# Patient Record
Sex: Female | Born: 1940
Health system: Southern US, Community
[De-identification: ages and names within clinical notes are randomized; demographics above are authoritative.]

## PROBLEM LIST (undated history)

## (undated) DIAGNOSIS — K279 Peptic ulcer, site unspecified, unspecified as acute or chronic, without hemorrhage or perforation: Secondary | ICD-10-CM

## (undated) DIAGNOSIS — I10 Essential (primary) hypertension: Secondary | ICD-10-CM

## (undated) DIAGNOSIS — K219 Gastro-esophageal reflux disease without esophagitis: Secondary | ICD-10-CM

## (undated) HISTORY — PX: CHOLECYSTECTOMY: SHX55

## (undated) HISTORY — PX: ABDOMINAL HYSTERECTOMY: SHX81

## (undated) HISTORY — PX: COLONOSCOPY: SHX174

## (undated) HISTORY — PX: APPENDECTOMY: SHX54

---

## 1999-12-11 ENCOUNTER — Other Ambulatory Visit: Admission: RE | Admit: 1999-12-11 | Discharge: 1999-12-11 | Payer: Self-pay | Admitting: Family Medicine

## 1999-12-15 ENCOUNTER — Encounter: Payer: Self-pay | Admitting: Family Medicine

## 1999-12-15 ENCOUNTER — Encounter: Admission: RE | Admit: 1999-12-15 | Discharge: 1999-12-15 | Payer: Self-pay | Admitting: Family Medicine

## 2000-07-19 ENCOUNTER — Encounter: Payer: Self-pay | Admitting: Family Medicine

## 2000-07-19 ENCOUNTER — Encounter: Admission: RE | Admit: 2000-07-19 | Discharge: 2000-07-19 | Payer: Self-pay | Admitting: Family Medicine

## 2001-02-08 ENCOUNTER — Encounter: Payer: Self-pay | Admitting: Family Medicine

## 2001-02-08 ENCOUNTER — Encounter: Admission: RE | Admit: 2001-02-08 | Discharge: 2001-02-08 | Payer: Self-pay | Admitting: Family Medicine

## 2003-04-11 ENCOUNTER — Other Ambulatory Visit: Admission: RE | Admit: 2003-04-11 | Discharge: 2003-04-11 | Payer: Self-pay | Admitting: Family Medicine

## 2003-05-03 ENCOUNTER — Encounter: Admission: RE | Admit: 2003-05-03 | Discharge: 2003-05-03 | Payer: Self-pay | Admitting: Family Medicine

## 2004-04-10 ENCOUNTER — Other Ambulatory Visit: Admission: RE | Admit: 2004-04-10 | Discharge: 2004-04-10 | Payer: Self-pay | Admitting: Family Medicine

## 2004-05-05 ENCOUNTER — Encounter: Admission: RE | Admit: 2004-05-05 | Discharge: 2004-05-05 | Payer: Self-pay | Admitting: Family Medicine

## 2005-04-16 ENCOUNTER — Other Ambulatory Visit: Admission: RE | Admit: 2005-04-16 | Discharge: 2005-04-16 | Payer: Self-pay | Admitting: Family Medicine

## 2005-05-15 ENCOUNTER — Encounter: Admission: RE | Admit: 2005-05-15 | Discharge: 2005-05-15 | Payer: Self-pay | Admitting: Family Medicine

## 2006-05-17 ENCOUNTER — Encounter: Admission: RE | Admit: 2006-05-17 | Discharge: 2006-05-17 | Payer: Self-pay | Admitting: Family Medicine

## 2006-06-10 ENCOUNTER — Other Ambulatory Visit: Admission: RE | Admit: 2006-06-10 | Discharge: 2006-06-10 | Payer: Self-pay | Admitting: Family Medicine

## 2007-05-18 ENCOUNTER — Encounter: Admission: RE | Admit: 2007-05-18 | Discharge: 2007-05-18 | Payer: Self-pay | Admitting: Family Medicine

## 2007-06-14 ENCOUNTER — Other Ambulatory Visit: Admission: RE | Admit: 2007-06-14 | Discharge: 2007-06-14 | Payer: Self-pay | Admitting: Family Medicine

## 2008-06-05 ENCOUNTER — Encounter: Admission: RE | Admit: 2008-06-05 | Discharge: 2008-06-05 | Payer: Self-pay | Admitting: Family Medicine

## 2008-06-13 ENCOUNTER — Other Ambulatory Visit: Admission: RE | Admit: 2008-06-13 | Discharge: 2008-06-13 | Payer: Self-pay | Admitting: Family Medicine

## 2009-06-26 ENCOUNTER — Encounter: Admission: RE | Admit: 2009-06-26 | Discharge: 2009-06-26 | Payer: Self-pay | Admitting: Family Medicine

## 2010-02-02 ENCOUNTER — Encounter: Payer: Self-pay | Admitting: Family Medicine

## 2010-06-11 ENCOUNTER — Other Ambulatory Visit: Payer: Self-pay | Admitting: Family Medicine

## 2010-06-11 DIAGNOSIS — Z1231 Encounter for screening mammogram for malignant neoplasm of breast: Secondary | ICD-10-CM

## 2010-07-01 ENCOUNTER — Ambulatory Visit
Admission: RE | Admit: 2010-07-01 | Discharge: 2010-07-01 | Disposition: A | Discharge status: Home / Self Care | Payer: Medicare Other | Source: Ambulatory Visit | Attending: Family Medicine | Admitting: Family Medicine

## 2010-07-01 DIAGNOSIS — Z1231 Encounter for screening mammogram for malignant neoplasm of breast: Secondary | ICD-10-CM

## 2010-07-21 ENCOUNTER — Other Ambulatory Visit: Payer: Self-pay | Admitting: Family Medicine

## 2010-07-21 ENCOUNTER — Other Ambulatory Visit (HOSPITAL_COMMUNITY)
Admission: RE | Admit: 2010-07-21 | Discharge: 2010-07-21 | Disposition: A | Discharge status: Home / Self Care | Payer: Medicare Other | Source: Ambulatory Visit | Attending: Family Medicine | Admitting: Family Medicine

## 2010-07-21 DIAGNOSIS — D069 Carcinoma in situ of cervix, unspecified: Secondary | ICD-10-CM | POA: Insufficient documentation

## 2011-01-31 DIAGNOSIS — J209 Acute bronchitis, unspecified: Secondary | ICD-10-CM | POA: Diagnosis not present

## 2011-01-31 DIAGNOSIS — R509 Fever, unspecified: Secondary | ICD-10-CM | POA: Diagnosis not present

## 2011-02-05 DIAGNOSIS — J9801 Acute bronchospasm: Secondary | ICD-10-CM | POA: Diagnosis not present

## 2011-02-05 DIAGNOSIS — J019 Acute sinusitis, unspecified: Secondary | ICD-10-CM | POA: Diagnosis not present

## 2011-06-23 DIAGNOSIS — R609 Edema, unspecified: Secondary | ICD-10-CM | POA: Diagnosis not present

## 2011-06-23 DIAGNOSIS — N3000 Acute cystitis without hematuria: Secondary | ICD-10-CM | POA: Diagnosis not present

## 2011-07-17 ENCOUNTER — Other Ambulatory Visit (HOSPITAL_COMMUNITY): Payer: Self-pay | Admitting: Family Medicine

## 2011-07-17 DIAGNOSIS — Z139 Encounter for screening, unspecified: Secondary | ICD-10-CM

## 2011-07-20 ENCOUNTER — Ambulatory Visit (HOSPITAL_COMMUNITY)
Admission: RE | Admit: 2011-07-20 | Discharge: 2011-07-20 | Disposition: A | Discharge status: Home / Self Care | Payer: Medicare Other | Source: Ambulatory Visit | Attending: Family Medicine | Admitting: Family Medicine

## 2011-07-20 DIAGNOSIS — Z1231 Encounter for screening mammogram for malignant neoplasm of breast: Secondary | ICD-10-CM | POA: Diagnosis not present

## 2011-07-20 DIAGNOSIS — Z139 Encounter for screening, unspecified: Secondary | ICD-10-CM

## 2011-08-19 ENCOUNTER — Encounter (INDEPENDENT_AMBULATORY_CARE_PROVIDER_SITE_OTHER): Payer: Self-pay | Admitting: *Deleted

## 2011-08-25 DIAGNOSIS — E7800 Pure hypercholesterolemia, unspecified: Secondary | ICD-10-CM | POA: Diagnosis not present

## 2011-08-25 DIAGNOSIS — M949 Disorder of cartilage, unspecified: Secondary | ICD-10-CM | POA: Diagnosis not present

## 2011-08-25 DIAGNOSIS — M899 Disorder of bone, unspecified: Secondary | ICD-10-CM | POA: Diagnosis not present

## 2011-08-25 DIAGNOSIS — Z1331 Encounter for screening for depression: Secondary | ICD-10-CM | POA: Diagnosis not present

## 2011-08-25 DIAGNOSIS — Z1211 Encounter for screening for malignant neoplasm of colon: Secondary | ICD-10-CM | POA: Diagnosis not present

## 2011-08-25 DIAGNOSIS — R7301 Impaired fasting glucose: Secondary | ICD-10-CM | POA: Diagnosis not present

## 2011-08-25 DIAGNOSIS — Z0000 Encounter for general adult medical examination without abnormal findings: Secondary | ICD-10-CM | POA: Diagnosis not present

## 2011-09-15 DIAGNOSIS — Z1211 Encounter for screening for malignant neoplasm of colon: Secondary | ICD-10-CM | POA: Diagnosis not present

## 2011-09-29 DIAGNOSIS — M899 Disorder of bone, unspecified: Secondary | ICD-10-CM | POA: Diagnosis not present

## 2011-10-07 DIAGNOSIS — Z23 Encounter for immunization: Secondary | ICD-10-CM | POA: Diagnosis not present

## 2011-10-27 DIAGNOSIS — H2510 Age-related nuclear cataract, unspecified eye: Secondary | ICD-10-CM | POA: Diagnosis not present

## 2011-10-27 DIAGNOSIS — H26019 Infantile and juvenile cortical, lamellar, or zonular cataract, unspecified eye: Secondary | ICD-10-CM | POA: Diagnosis not present

## 2011-10-27 DIAGNOSIS — H348392 Tributary (branch) retinal vein occlusion, unspecified eye, stable: Secondary | ICD-10-CM | POA: Diagnosis not present

## 2011-12-16 DIAGNOSIS — R05 Cough: Secondary | ICD-10-CM | POA: Diagnosis not present

## 2011-12-16 DIAGNOSIS — J069 Acute upper respiratory infection, unspecified: Secondary | ICD-10-CM | POA: Diagnosis not present

## 2012-01-12 DIAGNOSIS — N949 Unspecified condition associated with female genital organs and menstrual cycle: Secondary | ICD-10-CM | POA: Diagnosis not present

## 2012-01-29 DIAGNOSIS — J392 Other diseases of pharynx: Secondary | ICD-10-CM | POA: Diagnosis not present

## 2012-01-29 DIAGNOSIS — J210 Acute bronchiolitis due to respiratory syncytial virus: Secondary | ICD-10-CM | POA: Diagnosis not present

## 2012-04-11 ENCOUNTER — Other Ambulatory Visit: Payer: Self-pay | Admitting: Family Medicine

## 2012-04-11 DIAGNOSIS — R52 Pain, unspecified: Secondary | ICD-10-CM

## 2012-04-11 DIAGNOSIS — R609 Edema, unspecified: Secondary | ICD-10-CM

## 2012-04-11 DIAGNOSIS — E559 Vitamin D deficiency, unspecified: Secondary | ICD-10-CM | POA: Diagnosis not present

## 2012-04-12 ENCOUNTER — Ambulatory Visit
Admission: RE | Admit: 2012-04-12 | Discharge: 2012-04-12 | Disposition: A | Discharge status: Home / Self Care | Payer: Medicare Other | Source: Ambulatory Visit | Attending: Family Medicine | Admitting: Family Medicine

## 2012-04-12 DIAGNOSIS — M7989 Other specified soft tissue disorders: Secondary | ICD-10-CM | POA: Diagnosis not present

## 2012-04-12 DIAGNOSIS — M79609 Pain in unspecified limb: Secondary | ICD-10-CM | POA: Diagnosis not present

## 2012-04-12 DIAGNOSIS — R52 Pain, unspecified: Secondary | ICD-10-CM

## 2012-04-12 DIAGNOSIS — R609 Edema, unspecified: Secondary | ICD-10-CM

## 2012-07-17 DIAGNOSIS — R05 Cough: Secondary | ICD-10-CM | POA: Diagnosis not present

## 2012-07-21 ENCOUNTER — Other Ambulatory Visit: Payer: Self-pay | Admitting: Family Medicine

## 2012-07-21 ENCOUNTER — Ambulatory Visit
Admission: RE | Admit: 2012-07-21 | Discharge: 2012-07-21 | Disposition: A | Discharge status: Home / Self Care | Payer: Medicare Other | Source: Ambulatory Visit | Attending: Family Medicine | Admitting: Family Medicine

## 2012-07-21 DIAGNOSIS — R079 Chest pain, unspecified: Secondary | ICD-10-CM | POA: Diagnosis not present

## 2012-07-21 DIAGNOSIS — R05 Cough: Secondary | ICD-10-CM

## 2012-08-30 ENCOUNTER — Other Ambulatory Visit (HOSPITAL_COMMUNITY): Payer: Self-pay | Admitting: Family Medicine

## 2012-08-30 DIAGNOSIS — Z139 Encounter for screening, unspecified: Secondary | ICD-10-CM

## 2012-09-01 ENCOUNTER — Ambulatory Visit (HOSPITAL_COMMUNITY)
Admission: RE | Admit: 2012-09-01 | Discharge: 2012-09-01 | Disposition: A | Discharge status: Home / Self Care | Payer: Medicare Other | Source: Ambulatory Visit | Attending: Family Medicine | Admitting: Family Medicine

## 2012-09-01 DIAGNOSIS — Z1231 Encounter for screening mammogram for malignant neoplasm of breast: Secondary | ICD-10-CM | POA: Insufficient documentation

## 2012-09-01 DIAGNOSIS — Z139 Encounter for screening, unspecified: Secondary | ICD-10-CM

## 2012-09-05 DIAGNOSIS — R05 Cough: Secondary | ICD-10-CM | POA: Diagnosis not present

## 2012-09-05 DIAGNOSIS — J9801 Acute bronchospasm: Secondary | ICD-10-CM | POA: Diagnosis not present

## 2012-10-14 DIAGNOSIS — Z23 Encounter for immunization: Secondary | ICD-10-CM | POA: Diagnosis not present

## 2012-11-08 DIAGNOSIS — H2510 Age-related nuclear cataract, unspecified eye: Secondary | ICD-10-CM | POA: Diagnosis not present

## 2013-01-11 DIAGNOSIS — J210 Acute bronchiolitis due to respiratory syncytial virus: Secondary | ICD-10-CM | POA: Diagnosis not present

## 2013-01-11 DIAGNOSIS — J392 Other diseases of pharynx: Secondary | ICD-10-CM | POA: Diagnosis not present

## 2013-01-11 DIAGNOSIS — J069 Acute upper respiratory infection, unspecified: Secondary | ICD-10-CM | POA: Diagnosis not present

## 2013-03-05 ENCOUNTER — Encounter (HOSPITAL_COMMUNITY): Payer: Self-pay | Admitting: Emergency Medicine

## 2013-03-05 ENCOUNTER — Emergency Department (HOSPITAL_COMMUNITY): Payer: Medicare Other

## 2013-03-05 ENCOUNTER — Emergency Department (HOSPITAL_COMMUNITY)
Admission: EM | Admit: 2013-03-05 | Discharge: 2013-03-05 | Disposition: A | Discharge status: Home / Self Care | Payer: Medicare Other | Attending: Emergency Medicine | Admitting: Emergency Medicine

## 2013-03-05 DIAGNOSIS — I10 Essential (primary) hypertension: Secondary | ICD-10-CM | POA: Diagnosis not present

## 2013-03-05 DIAGNOSIS — Z1389 Encounter for screening for other disorder: Secondary | ICD-10-CM | POA: Insufficient documentation

## 2013-03-05 DIAGNOSIS — M62838 Other muscle spasm: Secondary | ICD-10-CM | POA: Diagnosis not present

## 2013-03-05 DIAGNOSIS — Z139 Encounter for screening, unspecified: Secondary | ICD-10-CM

## 2013-03-05 DIAGNOSIS — Z79899 Other long term (current) drug therapy: Secondary | ICD-10-CM | POA: Diagnosis not present

## 2013-03-05 DIAGNOSIS — H1132 Conjunctival hemorrhage, left eye: Secondary | ICD-10-CM

## 2013-03-05 DIAGNOSIS — H1130 Conjunctival hemorrhage, unspecified eye: Secondary | ICD-10-CM | POA: Diagnosis not present

## 2013-03-05 LAB — URINALYSIS, ROUTINE W REFLEX MICROSCOPIC
Bilirubin Urine: NEGATIVE
GLUCOSE, UA: NEGATIVE mg/dL
Hgb urine dipstick: NEGATIVE
Ketones, ur: NEGATIVE mg/dL
LEUKOCYTES UA: NEGATIVE
Nitrite: NEGATIVE
PH: 5.5 (ref 5.0–8.0)
PROTEIN: NEGATIVE mg/dL
Specific Gravity, Urine: 1.01 (ref 1.005–1.030)
UROBILINOGEN UA: 0.2 mg/dL (ref 0.0–1.0)

## 2013-03-05 LAB — CBC WITH DIFFERENTIAL/PLATELET
Basophils Absolute: 0.1 10*3/uL (ref 0.0–0.1)
Basophils Relative: 2 % — ABNORMAL HIGH (ref 0–1)
EOS ABS: 0.2 10*3/uL (ref 0.0–0.7)
Eosinophils Relative: 4 % (ref 0–5)
HCT: 39.2 % (ref 36.0–46.0)
Hemoglobin: 13.5 g/dL (ref 12.0–15.0)
LYMPHS PCT: 69 % — AB (ref 12–46)
Lymphs Abs: 2.8 10*3/uL (ref 0.7–4.0)
MCH: 30.6 pg (ref 26.0–34.0)
MCHC: 34.4 g/dL (ref 30.0–36.0)
MCV: 88.9 fL (ref 78.0–100.0)
MONO ABS: 0.6 10*3/uL (ref 0.1–1.0)
MONOS PCT: 15 % — AB (ref 3–12)
Neutro Abs: 0.4 10*3/uL — ABNORMAL LOW (ref 1.7–7.7)
Neutrophils Relative %: 10 % — ABNORMAL LOW (ref 43–77)
PLATELETS: 251 10*3/uL (ref 150–400)
RBC: 4.41 MIL/uL (ref 3.87–5.11)
RDW: 13.1 % (ref 11.5–15.5)
WBC: 4.1 10*3/uL (ref 4.0–10.5)

## 2013-03-05 LAB — BASIC METABOLIC PANEL
BUN: 13 mg/dL (ref 6–23)
CALCIUM: 9.8 mg/dL (ref 8.4–10.5)
CHLORIDE: 97 meq/L (ref 96–112)
CO2: 27 mEq/L (ref 19–32)
CREATININE: 0.9 mg/dL (ref 0.50–1.10)
GFR calc Af Amer: 72 mL/min — ABNORMAL LOW (ref >90)
GFR calc non Af Amer: 62 mL/min — ABNORMAL LOW (ref >90)
Glucose, Bld: 94 mg/dL (ref 70–99)
POTASSIUM: 4.2 meq/L (ref 3.7–5.3)
Sodium: 136 mEq/L — ABNORMAL LOW (ref 137–147)

## 2013-03-05 LAB — TROPONIN I: Troponin I: <0.3 ng/mL (ref <0.30)

## 2013-03-05 NOTE — ED Notes (Signed)
Pt reports had blood vessel break in r eye 1 week ago.  Yesterday pt says started having a funny feeling in left shoulder.   Pt says checked her bp and it was high.  This morning pt still "just doesn't feel right."  Reports her bp today and yesterday was 169/92.  Denies history of htn.

## 2013-03-05 NOTE — Discharge Instructions (Signed)
°Emergency Department Resource Guide °1) Find a Doctor and Pay Out of Pocket °Although you won't have to find out who is covered by your insurance plan, it is a good idea to ask around and get recommendations. You will then need to call the office and see if the doctor you have chosen will accept you as a new patient and what types of options they offer for patients who are self-pay. Some doctors offer discounts or will set up payment plans for their patients who do not have insurance, but you will need to ask so you aren't surprised when you get to your appointment. ° °2) Contact Your Local Health Department °Not all health departments have doctors that can see patients for sick visits, but many do, so it is worth a call to see if yours does. If you don't know where your local health department is, you can check in your phone book. The CDC also has a tool to help you locate your state's health department, and many state websites also have listings of all of their local health departments. ° °3) Find a Walk-in Clinic °If your illness is not likely to be very severe or complicated, you may want to try a walk in clinic. These are popping up all over the country in pharmacies, drugstores, and shopping centers. They're usually staffed by nurse practitioners or physician assistants that have been trained to treat common illnesses and complaints. They're usually fairly quick and inexpensive. However, if you have serious medical issues or chronic medical problems, these are probably not your best option. ° °No Primary Care Doctor: °- Call [facility] at  832-8000 - they can help you locate a primary care doctor that  accepts your insurance, provides certain services, etc. °- Physician Referral Service- 1-800-533-3463 ° °Chronic Pain Problems: °Organization         Address  Phone   Notes  °[facility]  (336) 297-2271 Patients need to be referred by their primary care doctor.  ° °Medication  Assistance: °Organization         Address  Phone   Notes  °[place] [facility] [address] °[place], [place] [address] (336) 641-8030 --Must be a resident of [place] °-- Must have NO insurance coverage whatsoever (no Medicaid/ Medicare, etc.) °-- The pt. MUST have a primary care doctor that directs their care regularly and follows them in the community °  °[facility]  (866) 331-1348   °[employer]  (888) 892-1162   ° °Agencies that provide inexpensive medical care: °Organization         Address  Phone   Notes  °[facility]  (336) 832-8035   °[facility]    (336) 832-7272   °[facility] [address] °[place], [place] [address] (336) 832-4777   °[facility] [address], °[place] (336) 271-4999   °[facility]    (336) 373-0678   °[facility]    (336) 272-1050   °[facility] ° [address], [place] Phone:  (336) 832-4444, Fax:  (336) 832-4440 Hours of Operation:  9 am - 6 pm, M-F.  Also accepts Medicaid/Medicare and self-pay.  °[facility] ° [address],  Phone: (336) 832-3150, Fax: (336) 832-3151. Hours of Operation:  8:30 am - 5:30 pm, M-F.  Also accepts Medicaid and self-pay.  °[facility] [place] [address], [place] Phone: (336) 878-6027   °[facility] [address], [place], [place] (336)723-1848, Ext. 123 Mondays & Thursdays: 7-9 AM.  First 15 patients are seen on a first come, first serve basis. °  ° °Medicaid-accepting [place] Providers: ° °Organization         Address  Phone   Notes  °[facility] [address], [place] (336) 641-2100 Also accepts self-pay patients.  °[facility] [address], [place] ° (336) 856-9996   °[facility] [address], [place]  (336) 288-8857   °[facility] [address] [place] [address], [place] (336) 299-7000   °Veita Bland [address], [place]  ° (336) 373-1557 Only accepts [place] Access Medicaid patients after they have their name applied to their card.  ° °Self-Pay (no insurance) in [place]: ° °Organization         Address  Phone   Notes  °Sickle Cell Patients, [facility] [address], [place] (336) 832-1970   °[facility] [address], [place] (336) 832-4400   °[facility] ° [address], [place] (336) 992-4800   °[facility]/Dr. Osei-Bonsu ° [address] [place] [address], [place] or [address], [place] (336) [phone] Phone number for both [place] and [place] locations is the same.  °[facility] [address], [place] (336) 299-0000   °[facility] [address] [place] [address], [place] or [address] (336) 852-7530 °(336) 878-2260   °[facility] [address], [place] (336) 350-1642, phone; (336) 294-5005, fax Sees patients 1st and 3rd Saturday of every month.  Must not qualify for public or private insurance (i.e. Medicaid, Medicare, [place] Health Choice, Veterans' Benefits) • Household income should be no more than 200% of the poverty level •The clinic cannot treat you if you are pregnant or think you are pregnant • Sexually transmitted diseases are not treated at the clinic.  ° ° °Dental Care: °Organization         Address  Phone  Notes  °[place] [facility] [address], [place] (336) 641-6152 Accepts children up to age 21 who are enrolled in Medicaid or [place] Health Choice; pregnant women with a Medicaid card; and children who have applied for Medicaid or [place] Health Choice, but were declined, whose parents can pay a reduced fee at time of service.  °[place]  [facility] [place]  [address], [place] (336) 641-7733 Accepts children up to age 21 who are enrolled in Medicaid or [place] Health Choice; pregnant women with a Medicaid card; and children who have applied for Medicaid or  Health Choice, but were declined, whose parents can pay a reduced fee at time of service.  °[facility] ° [address], [place] (336) 641-4533 Patients are seen by appointment only. Walk-ins are not accepted. [facility] will see patients 18 years of age and older. °Monday - Tuesday (8am-5pm) °Most Wednesdays (8:30-5pm) °$30 per visit, cash only  °[facility] ° [address], [place] (336) 641-4533 Patients are seen by appointment only. Walk-ins are not accepted. [facility] will see patients 18 years of age and older. °One   Wednesday Evening (Monthly: Volunteer Based).  $30 per visit, cash only  °[facility]  (919) 537-3737 for adults; Children under age 4, call Graduate Pediatric Dentistry at ([phone]) [phone]. Children aged 4-14, please call ([phone]) [phone] to request a pediatric application. ° Dental services are provided in all areas of dental care including fillings, crowns and bridges, complete and partial dentures, implants, gum treatment, root canals, and extractions. Preventive care is also provided. Treatment is provided to both adults and children. °Patients are selected via a lottery and there is often a waiting list. °  °[facility] [address], °[place] ° (336) 763-8833 [email or web address] °  °[facility] [address], [place], [place] (336)723-1848, Ext. 123 Second and Fourth Thursday of each month, opens at 6:30 AM; Clinic ends at 9 AM.  Patients are seen on a first-come first-served basis, and a limited number are seen during each clinic.  ° °[facility] ° [address], [place], [place] (336) 723-7904    Eligibility Requirements °You must have lived in [place], [place], or [place] counties for at least the last three months. °  You cannot be eligible for state or federal sponsored healthcare insurance, including Veterans Administration, Medicaid, or Medicare. °  You generally cannot be eligible for healthcare insurance through your employer.  °  How to apply: °Eligibility screenings are held every Tuesday and Wednesday afternoon from 1:00 pm until 4:00 pm. You do not need an appointment for the interview!  °[facility] [address], [place], [place] 336-631-2330   °[facility]  336-342-8273   °[facility]  336-703-3100   °[facility]  336-570-6415   ° °Behavioral Health Resources in the Community: °Intensive Outpatient Programs °Organization         Address  Phone  Notes  °[facility] [address], [place], [place] 336-878-6098   °[facility] [address], [place], [place] 336-832-9800   °[facility] [address], [place], [place] ° 336-882-2125   °[facility] [address],  °[place], [place] 1-800-853-5163 or 336-641-4981   °Substance Abuse Resources °Organization         Address  Phone  Notes  °[facility]  336-882-2125   °[facility]  336-784-9470   °[facility]  336-285-9073   °[facility]  336-845-3988   °Residential & Outpatient Substance Abuse Program  1-800-659-3381   °Psychological Services °Organization         Address  Phone  Notes  °[facility]  336- 832-9600   °[facility]  336- 378-7881   °[facility] [address], [place] 1-800-853-5163 or 336-641-4981   ° °Mobile Crisis Teams °Organization         Address  Phone  Notes  °[facility], Mobile Crisis Care Unit  1-877-626-1772   °Assertive °[facility] ° [address]  [place], [place] 336-834-9664   °Sharon DeEsch [address] °[place] 336-554-5454   ° °Self-Help/Support Groups °Organization         Address  Phone             Notes  °[facility]  - variety of support groups  336- 373-1402 Call for more information  °Narcotics Anonymous (NA), [facility] [address], °[place]   2 meetings at this location  ° °  Residential Treatment Programs Organization         Address  Phone  Notes  [facility] [address],    [place]  1-640 750 0387   [facility]  [address], [place] [address], [place], [place]   [place], [place] [phone] Admissions: 8am-3pm M-F  [facility] [address],    [place], [place] [phone]   [facility] [address] [place], [place], [place]   [facility] [address],  [place], [place]   [facility] Intensive Outpatient [address], [place], [place]   [facility] ([place]  [place], [place] 1-(332)303-9587 or 203-087-6852   [facility] [address] [place], [place] Accepts Medicaid  [facility] [address],  [place] 1-5645175194 Substance Abuse/Addiction Treatment   [facility] Organization         Address  Phone  Notes  [facility]  705-014-8265   Domenic Schwab, PhD [address] [place], [place]   909-318-4954 or (256)726-5286   [facility], [place] [phone]   [facility] [address] [place], [place] [phone] Insurance/Medicaid/sponsorship through [facility] and Families [address]                                    [place], [place] [phone] [place] [address], [place] [phone]    Dr. Adele Schilder  ([phone]   [facility] Dept. 1) [address] [place] 2) [place] 3)  [address], [place] [phone]) [phone]  [phone]   [place] (704) [phone] or [phone] (After Hours)       Take your usual prescriptions as previously directed. Take over the counter tylenol and/or ibuprofen, as directed on packaging, as needed for discomfort.  Apply moist heat or ice to the area(s) of discomfort, for 15 minutes at a time, several times per day for the next few days.  Do not fall asleep on a heating or ice pack. Take your blood pressure only once per day, either in the morning after you take your medicine(s) or in the evening before you go to bed.  Always sit quietly for at least 15 minutes before taking your blood pressure.  Keep a diary of your blood pressures to show your doctor at your follow up office visit.  Call your regular medical doctor on Monday morning to schedule a follow up appointment in the next 2 days.  Return to the Emergency Department immediately sooner if worsening.

## 2013-03-05 NOTE — ED Provider Notes (Signed)
CSN: [id number]     Arrival date & time 03/05/13  1045 History   First MD Initiated Contact with Patient 03/05/13 1309     Chief Complaint  Patient presents with  . Hypertension     HPI Pt was seen at 1310. Per pt, c/o gradual onset and persistence of constant "high blood pressure" since yesterday. Pt states she woke up yesterday "not feeling right," and having left trapezius muscle discomfort. Left neck discomfort increases with palpation of the area and body position changes; described as "cramping" and "sore." Pt cannot further describe "not feeling right." Pt states she decided to take her BP and it was "169/92." States she then took her BP every 30 minutes for the next several hours "and my blood pressure just kept going higher and higher" which "got me more and more upset."  Pt states she then went to bed and took her BP again this morning with SBP 150's. Pt states she then became "more worried" so she came to the ED for further evaluation. Pt also c/o left conjunctival hemorrhage approximately 1 week ago that started "after sneezing," as well as improved cough since last month. Currently denies cough/SOB, no CP/palpitaitons, no abd pain, no N/V/D, no back pain, no eye pain/injury, no visual changes, no focal motor weakness, no tingling/numbness in extremities, no ataxia, no slurred speech, no facial droop.      History reviewed. No pertinent past medical history.  Past Surgical History  Procedure Laterality Date  . Abdominal hysterectomy    . Appendectomy    . Cholecystectomy      History  Substance Use Topics  . Smoking status: Never Smoker   . Smokeless tobacco: Not on file  . Alcohol Use: No    Review of Systems ROS: Statement: All systems negative except as marked or noted in the HPI; Constitutional: Negative for fever and chills. +"I don't feel right."; ; Eyes: Negative for eye pain, redness and discharge. ; ; ENMT: Negative for ear pain, hoarseness, nasal congestion, sinus  pressure and sore throat. ; ; Cardiovascular: Negative for chest pain, palpitations, diaphoresis, dyspnea and peripheral edema. ; ; Respiratory: Negative for cough, wheezing and stridor. ; ; Gastrointestinal: Negative for nausea, vomiting, diarrhea, abdominal pain, blood in stool, hematemesis, jaundice and rectal bleeding. . ; ; Genitourinary: Negative for dysuria, flank pain and hematuria. ; ; Musculoskeletal: +left neck pain. Negative for back pain. Negative for swelling and trauma.; ; Skin: Negative for pruritus, rash, abrasions, blisters, bruising and skin lesion.; ; Neuro: Negative for headache, lightheadedness and neck stiffness. Negative for weakness, altered level of consciousness , altered mental status, extremity weakness, paresthesias, involuntary movement, seizure and syncope.; Psych:  +anxiety. No SI, no SA, no HI, no hallucinations.      Allergies  Review of patient's allergies indicates no known allergies.  Home Medications   Current Outpatient Rx  Name  Route  Sig  Dispense  Refill  . aspirin EC 81 MG tablet   Oral   Take 81 mg by mouth daily as needed for moderate pain.         . Calcium Carb-Cholecalciferol (CALCIUM 1000 + D PO)   Oral   Take 1 tablet by mouth daily.         . Cholecalciferol (VITAMIN D) 2000 UNITS CAPS   Oral   Take 1 capsule by mouth daily.         . Multiple Vitamin (MULTIVITAMIN WITH MINERALS) TABS tablet   Oral   Take  1 tablet by mouth daily.         . ranitidine (ZANTAC) 150 MG tablet   Oral   Take 150 mg by mouth daily as needed for heartburn.         . Tetrahydrozoline HCl (VISINE EXTRA OP)   Left Eye   Place 1 drop into the left eye 3 (three) times daily as needed (irritation).          BP 150/73  Pulse 56  Temp(Src) 98.1 F (36.7 C) (Oral)  Resp 17  Ht 5\' 7"  (1.702 m)  Wt 195 lb (88.451 kg)  BMI 30.53 kg/m2  SpO2 98% Physical Exam 1315: Physical examination:  Nursing notes reviewed; Vital signs and O2 SAT  reviewed;  Constitutional: Well developed, Well nourished, Well hydrated, In no acute distress; Head:  Normocephalic, atraumatic; Eyes: EOMI without pain bilat. No proptosis. No obvious hyphema or hypopyon bilat. PERRL, No scleral icterus. +left subconjunctival hemorrhage.; ENMT: Mouth and pharynx normal, Mucous membranes moist; Neck: Supple, no meningeal signs. Full range of motion, No lymphadenopathy; Cardiovascular: Regular rate and rhythm, No gallop; Respiratory: Breath sounds clear & equal bilaterally, No rales, rhonchi, wheezes.  Speaking full sentences with ease, Normal respiratory effort/excursion; Chest: Nontender, Movement normal; Abdomen: Soft, Nontender, Nondistended, Normal bowel sounds; Genitourinary: No CVA tenderness; Spine:  No midline CS, TS, LS tenderness. +TTP left hypertonic trapezius muscle. No rash.;; Extremities: Pulses normal, No tenderness, No edema, No calf edema or asymmetry.; Neuro: AA&Ox3, Major CN grossly intact.Speech clear.  No facial droop.  No nystagmus. Grips equal. Strength 5/5 equal bilat UE's and LE's.  DTR 2/4 equal bilat UE's and LE's.  No gross sensory deficits.  Normal cerebellar testing bilat UE's (finger-nose) and LE's (heel-shin). Climbs on and off stretcher easily by herself. Gait steady..; Skin: Color normal, Warm, Dry.; Psych:  Anxious.   ED Course  Procedures    EKG Interpretation    Date/Time:  Sunday March 05 2013 11:39:38 EST Ventricular Rate:  60 PR Interval:  172 QRS Duration: 90 QT Interval:  426 QTC Calculation: 426 R Axis:   22 Text Interpretation:  Normal sinus rhythm Baseline wander No previous ECGs available Confirmed by [facility]  MD, Nunzio Cory [phone]) on 03/05/2013 1:31:14 PM            MDM  MDM Reviewed: previous chart, nursing note and vitals Interpretation: labs, ECG and x-ray   Results for orders placed during the hospital encounter of 03/05/13  CBC WITH DIFFERENTIAL      Result Value Ref Range   WBC 4.1  4.0 - 10.5  K/uL   RBC 4.41  3.87 - 5.11 MIL/uL   Hemoglobin 13.5  12.0 - 15.0 g/dL   HCT 39.2  36.0 - 46.0 %   MCV 88.9  78.0 - 100.0 fL   MCH 30.6  26.0 - 34.0 pg   MCHC 34.4  30.0 - 36.0 g/dL   RDW 13.1  11.5 - 15.5 %   Platelets 251  150 - 400 K/uL   Neutrophils Relative % 10 (*) 43 - 77 %   Lymphocytes Relative 69 (*) 12 - 46 %   Monocytes Relative 15 (*) 3 - 12 %   Eosinophils Relative 4  0 - 5 %   Basophils Relative 2 (*) 0 - 1 %   Neutro Abs 0.4 (*) 1.7 - 7.7 K/uL   Lymphs Abs 2.8  0.7 - 4.0 K/uL   Monocytes Absolute 0.6  0.1 - 1.0 K/uL   Eosinophils  Absolute 0.2  0.0 - 0.7 K/uL   Basophils Absolute 0.1  0.0 - 0.1 K/uL   WBC Morphology ATYPICAL LYMPHOCYTES    BASIC METABOLIC PANEL      Result Value Ref Range   Sodium 136 (*) 137 - 147 mEq/L   Potassium 4.2  3.7 - 5.3 mEq/L   Chloride 97  96 - 112 mEq/L   CO2 27  19 - 32 mEq/L   Glucose, Bld 94  70 - 99 mg/dL   BUN 13  6 - 23 mg/dL   Creatinine, Ser 0.90  0.50 - 1.10 mg/dL   Calcium 9.8  8.4 - 10.5 mg/dL   GFR calc non Af Amer 62 (*) >90 mL/min   GFR calc Af Amer 72 (*) >90 mL/min  URINALYSIS, ROUTINE W REFLEX MICROSCOPIC      Result Value Ref Range   Color, Urine YELLOW  YELLOW   APPearance CLEAR  CLEAR   Specific Gravity, Urine 1.010  1.005 - 1.030   pH 5.5  5.0 - 8.0   Glucose, UA NEGATIVE  NEGATIVE mg/dL   Hgb urine dipstick NEGATIVE  NEGATIVE   Bilirubin Urine NEGATIVE  NEGATIVE   Ketones, ur NEGATIVE  NEGATIVE mg/dL   Protein, ur NEGATIVE  NEGATIVE mg/dL   Urobilinogen, UA 0.2  0.0 - 1.0 mg/dL   Nitrite NEGATIVE  NEGATIVE   Leukocytes, UA NEGATIVE  NEGATIVE  TROPONIN I      Result Value Ref Range   Troponin I <0.30  <0.30 ng/mL   Dg Chest 2 View 03/05/2013   CLINICAL DATA:  Hypertension  EXAM: CHEST  2 VIEW  COMPARISON:  07/21/2012  FINDINGS: Cardiac enlargement. Negative for heart failure. Vascularity is normal. Lungs are clear without infiltrate effusion or mass.  IMPRESSION: No active cardiopulmonary disease.    Electronically Signed   By: Franchot Gallo M.D.   On: 03/05/2013 14:10    1445:  Appears calmer, talking with family in exam room. States she feels better and wants to go home. Not orthostatic, ambulatory with steady gait. Long d/w pt regarding how to take BP at home, appropriate elevation of BP, etc; verb understanding. Will tx msk pain left neck symptomatically at this time. Dx and testing d/w pt and family.  Questions answered.  Verb understanding, agreeable to d/c home with outpt f/u.   Alfonzo Feller, DO 03/08/13 [phone]

## 2013-03-05 NOTE — ED Notes (Signed)
Pt resting quietly.  Denies any pain  

## 2013-03-30 DIAGNOSIS — R030 Elevated blood-pressure reading, without diagnosis of hypertension: Secondary | ICD-10-CM | POA: Diagnosis not present

## 2013-07-27 DIAGNOSIS — B360 Pityriasis versicolor: Secondary | ICD-10-CM | POA: Diagnosis not present

## 2013-07-27 DIAGNOSIS — L821 Other seborrheic keratosis: Secondary | ICD-10-CM | POA: Diagnosis not present

## 2013-09-08 ENCOUNTER — Other Ambulatory Visit (HOSPITAL_COMMUNITY): Payer: Self-pay | Admitting: Family Medicine

## 2013-09-08 DIAGNOSIS — Z1231 Encounter for screening mammogram for malignant neoplasm of breast: Secondary | ICD-10-CM

## 2013-09-14 ENCOUNTER — Ambulatory Visit (HOSPITAL_COMMUNITY)
Admission: RE | Admit: 2013-09-14 | Discharge: 2013-09-14 | Disposition: A | Discharge status: Home / Self Care | Payer: Medicare Other | Source: Ambulatory Visit | Attending: Family Medicine | Admitting: Family Medicine

## 2013-09-14 DIAGNOSIS — Z1231 Encounter for screening mammogram for malignant neoplasm of breast: Secondary | ICD-10-CM | POA: Diagnosis not present

## 2013-11-13 DIAGNOSIS — Z23 Encounter for immunization: Secondary | ICD-10-CM | POA: Diagnosis not present

## 2013-11-14 DIAGNOSIS — H34831 Tributary (branch) retinal vein occlusion, right eye: Secondary | ICD-10-CM | POA: Diagnosis not present

## 2013-11-14 DIAGNOSIS — H2513 Age-related nuclear cataract, bilateral: Secondary | ICD-10-CM | POA: Diagnosis not present

## 2014-01-08 DIAGNOSIS — K529 Noninfective gastroenteritis and colitis, unspecified: Secondary | ICD-10-CM | POA: Diagnosis not present

## 2014-01-09 ENCOUNTER — Emergency Department (HOSPITAL_COMMUNITY)
Admission: EM | Admit: 2014-01-09 | Discharge: 2014-01-10 | Disposition: A | Discharge status: Home / Self Care | Payer: Medicare Other | Attending: Emergency Medicine | Admitting: Emergency Medicine

## 2014-01-09 ENCOUNTER — Encounter (HOSPITAL_COMMUNITY): Payer: Self-pay | Admitting: *Deleted

## 2014-01-09 ENCOUNTER — Emergency Department (HOSPITAL_COMMUNITY): Payer: Medicare Other

## 2014-01-09 DIAGNOSIS — R52 Pain, unspecified: Secondary | ICD-10-CM

## 2014-01-09 DIAGNOSIS — K279 Peptic ulcer, site unspecified, unspecified as acute or chronic, without hemorrhage or perforation: Secondary | ICD-10-CM | POA: Diagnosis not present

## 2014-01-09 DIAGNOSIS — R197 Diarrhea, unspecified: Secondary | ICD-10-CM | POA: Diagnosis not present

## 2014-01-09 DIAGNOSIS — Z79899 Other long term (current) drug therapy: Secondary | ICD-10-CM | POA: Insufficient documentation

## 2014-01-09 DIAGNOSIS — Z90710 Acquired absence of both cervix and uterus: Secondary | ICD-10-CM | POA: Insufficient documentation

## 2014-01-09 DIAGNOSIS — R109 Unspecified abdominal pain: Secondary | ICD-10-CM | POA: Diagnosis not present

## 2014-01-09 DIAGNOSIS — R1013 Epigastric pain: Secondary | ICD-10-CM | POA: Diagnosis present

## 2014-01-09 DIAGNOSIS — Z9089 Acquired absence of other organs: Secondary | ICD-10-CM | POA: Insufficient documentation

## 2014-01-09 DIAGNOSIS — Z7982 Long term (current) use of aspirin: Secondary | ICD-10-CM | POA: Insufficient documentation

## 2014-01-09 DIAGNOSIS — R110 Nausea: Secondary | ICD-10-CM | POA: Diagnosis not present

## 2014-01-09 LAB — COMPREHENSIVE METABOLIC PANEL
ALBUMIN: 4.4 g/dL (ref 3.5–5.2)
ALK PHOS: 71 U/L (ref 39–117)
ALT: 37 U/L — AB (ref 0–35)
AST: 31 U/L (ref 0–37)
Anion gap: 11 (ref 5–15)
BILIRUBIN TOTAL: 1 mg/dL (ref 0.3–1.2)
BUN: 19 mg/dL (ref 6–23)
CO2: 24 mmol/L (ref 19–32)
Calcium: 9.5 mg/dL (ref 8.4–10.5)
Chloride: 97 mEq/L (ref 96–112)
Creatinine, Ser: 1.02 mg/dL (ref 0.50–1.10)
GFR calc Af Amer: 62 mL/min — ABNORMAL LOW (ref >90)
GFR calc non Af Amer: 53 mL/min — ABNORMAL LOW (ref >90)
Glucose, Bld: 133 mg/dL — ABNORMAL HIGH (ref 70–99)
POTASSIUM: 3.9 mmol/L (ref 3.5–5.1)
SODIUM: 132 mmol/L — AB (ref 135–145)
Total Protein: 7.6 g/dL (ref 6.0–8.3)

## 2014-01-09 LAB — CBC WITH DIFFERENTIAL/PLATELET
BASOS PCT: 1 % (ref 0–1)
Basophils Absolute: 0.1 10*3/uL (ref 0.0–0.1)
Eosinophils Absolute: 0 10*3/uL (ref 0.0–0.7)
Eosinophils Relative: 1 % (ref 0–5)
HCT: 39.9 % (ref 36.0–46.0)
HEMOGLOBIN: 13.8 g/dL (ref 12.0–15.0)
Lymphocytes Relative: 24 % (ref 12–46)
Lymphs Abs: 1.7 10*3/uL (ref 0.7–4.0)
MCH: 30.2 pg (ref 26.0–34.0)
MCHC: 34.6 g/dL (ref 30.0–36.0)
MCV: 87.3 fL (ref 78.0–100.0)
MONOS PCT: 19 % — AB (ref 3–12)
Monocytes Absolute: 1.4 10*3/uL — ABNORMAL HIGH (ref 0.1–1.0)
Neutro Abs: 4 10*3/uL (ref 1.7–7.7)
Neutrophils Relative %: 55 % (ref 43–77)
Platelets: 315 10*3/uL (ref 150–400)
RBC: 4.57 MIL/uL (ref 3.87–5.11)
RDW: 12.3 % (ref 11.5–15.5)
WBC: 7.2 10*3/uL (ref 4.0–10.5)

## 2014-01-09 LAB — LIPASE, BLOOD: Lipase: 42 U/L (ref 11–59)

## 2014-01-09 MED ORDER — IOHEXOL 300 MG/ML  SOLN
100.0000 mL | Freq: Once | INTRAMUSCULAR | Status: AC | PRN
  Administered 2014-01-09: 100 mL via INTRAVENOUS

## 2014-01-09 MED ORDER — ONDANSETRON HCL 4 MG/2ML IJ SOLN
4.0000 mg | Freq: Once | INTRAMUSCULAR | Status: AC
  Administered 2014-01-09: 4 mg via INTRAVENOUS
  Filled 2014-01-09: qty 2

## 2014-01-09 MED ORDER — TRAMADOL HCL 50 MG PO TABS: 50.0000 mg | ORAL_TABLET | Freq: Four times a day (QID) | ORAL | Status: DC | PRN

## 2014-01-09 MED ORDER — SODIUM CHLORIDE 0.9 % IV BOLUS (SEPSIS)
1000.0000 mL | Freq: Once | INTRAVENOUS | Status: AC
  Administered 2014-01-09: 1000 mL via INTRAVENOUS

## 2014-01-09 MED ORDER — HYDROMORPHONE HCL 1 MG/ML IJ SOLN
0.5000 mg | Freq: Once | INTRAMUSCULAR | Status: AC
  Administered 2014-01-09: 0.5 mg via INTRAVENOUS
  Filled 2014-01-09: qty 1

## 2014-01-09 MED ORDER — IOHEXOL 300 MG/ML  SOLN
25.0000 mL | Freq: Once | INTRAMUSCULAR | Status: AC | PRN
  Administered 2014-01-09: 25 mL via ORAL

## 2014-01-09 MED ORDER — PANTOPRAZOLE SODIUM 40 MG IV SOLR
40.0000 mg | Freq: Once | INTRAVENOUS | Status: AC
  Administered 2014-01-09: 40 mg via INTRAVENOUS
  Filled 2014-01-09: qty 40

## 2014-01-09 NOTE — ED Notes (Signed)
Pt's contact granddaughter Ronnie Derby.

## 2014-01-09 NOTE — ED Provider Notes (Signed)
CSN: [id number]     Arrival date & time 01/09/14  1441 History  This chart was scribed for Maudry Diego, MD by Martinique Peace, ED Scribe. The patient was seen in [place]. The patient's care was started at 6:44 PM.    Chief Complaint  Patient presents with  . Abdominal Pain      Patient is a 73 y.o. female presenting with abdominal pain. The history is provided by the patient (pt complains of nausea and abd pain). No language interpreter was used.  Abdominal Pain Pain location:  Epigastric Pain quality: aching   Pain radiates to:  Does not radiate Pain severity:  Moderate Onset quality:  Gradual Progression:  Waxing and waning Chronicity:  New Associated symptoms: diarrhea, nausea and vomiting   Associated symptoms: no chest pain, no cough, no fatigue and no hematuria     HPI Comments: Jaime Lin is a 73 y.o. female who presents to the Emergency Department complaining of abdominal pain with nausea, vomiting, and diarrhea onset 3 days ago. Pt reports that she has not been able to keep any food down. She notes that she went to see her PCP yesterday and was prescribed Zofran and told to return if symptoms worsened. She denies any improvement since taking prescription. She reports that she has also vomited several times today but has not had any episodes of diarrhea. History of past surgery on gall bladder and appendix. Pt also reports history of abdominal hysterectomy.    History reviewed. No pertinent past medical history. Past Surgical History  Procedure Laterality Date  . Abdominal hysterectomy    . Appendectomy    . Cholecystectomy     History reviewed. No pertinent family history. History  Substance Use Topics  . Smoking status: Never Smoker   . Smokeless tobacco: Not on file  . Alcohol Use: No   OB History    No data available     Review of Systems  Constitutional: Negative for appetite change and fatigue.  HENT: Negative for congestion, ear discharge and sinus  pressure.   Eyes: Negative for discharge.  Respiratory: Negative for cough.   Cardiovascular: Negative for chest pain.  Gastrointestinal: Positive for nausea, vomiting, abdominal pain and diarrhea.  Genitourinary: Negative for frequency and hematuria.  Musculoskeletal: Negative for back pain.  Skin: Negative for rash.  Neurological: Negative for seizures and headaches.  Psychiatric/Behavioral: Negative for hallucinations.      Allergies  Review of patient's allergies indicates no known allergies.  Home Medications   Prior to Admission medications   Medication Sig Start Date End Date Taking? Authorizing Provider  aspirin EC 81 MG tablet Take 81 mg by mouth daily as needed for moderate pain.    Historical Provider, MD  Calcium Carb-Cholecalciferol (CALCIUM 1000 + D PO) Take 1 tablet by mouth daily.    Historical Provider, MD  Cholecalciferol (VITAMIN D) 2000 UNITS CAPS Take 1 capsule by mouth daily.    Historical Provider, MD  Multiple Vitamin (MULTIVITAMIN WITH MINERALS) TABS tablet Take 1 tablet by mouth daily.    Historical Provider, MD  ranitidine (ZANTAC) 150 MG tablet Take 150 mg by mouth daily as needed for heartburn.    Historical Provider, MD  Tetrahydrozoline HCl (VISINE EXTRA OP) Place 1 drop into the left eye 3 (three) times daily as needed (irritation).    Historical Provider, MD   BP 117/82 mmHg  Pulse 85  Temp(Src) 98.6 F (37 C) (Oral)  Resp 20  Ht 5\' 7"  (1.702 m)  Wt 190 lb (86.183 kg)  BMI 29.75 kg/m2  SpO2 99% Physical Exam  Constitutional: She is oriented to person, place, and time. She appears well-developed.  HENT:  Head: Normocephalic.  Eyes: Conjunctivae and EOM are normal. No scleral icterus.  Neck: Neck supple. No thyromegaly present.  Cardiovascular: Normal rate and regular rhythm.  Exam reveals no gallop and no friction rub.   No murmur heard. Pulmonary/Chest: No stridor. She has no wheezes. She has no rales. She exhibits no tenderness.   Abdominal: She exhibits no distension. There is tenderness. There is no rebound.  Minimal epigastric tenderness.  Musculoskeletal: Normal range of motion. She exhibits no edema.  Lymphadenopathy:    She has no cervical adenopathy.  Neurological: She is oriented to person, place, and time. She exhibits normal muscle tone. Coordination normal.  Skin: No rash noted. No erythema.  Psychiatric: She has a normal mood and affect. Her behavior is normal.    ED Course  Procedures (including critical care time) Labs Review Labs Reviewed  CBC WITH DIFFERENTIAL  COMPREHENSIVE METABOLIC PANEL    Imaging Review No results found.   EKG Interpretation None     Medications - No data to display  6:48 PM- Treatment plan was discussed with patient who verbalizes understanding and agrees.   MDM   Final diagnoses:  None    Pud,   tx with prilosec  I personally performed the services described in this documentation, which was scribed in my presence. The recorded information has been reviewed and is accurate.   Maudry Diego, MD 01/09/14 [phone]

## 2014-01-09 NOTE — Discharge Instructions (Signed)
Take your prilosec twice a day.  Use your zofran every 4hours for nausea.   Follow up with your md next week.

## 2014-01-09 NOTE — ED Notes (Signed)
abd pain, nausea, vomiting  Diarrhea ,   Went to MD yesterday and started zofran, , no relief.

## 2014-01-10 LAB — POC OCCULT BLOOD, ED: Fecal Occult Bld: POSITIVE — AB

## 2014-01-19 DIAGNOSIS — Z1389 Encounter for screening for other disorder: Secondary | ICD-10-CM | POA: Diagnosis not present

## 2014-01-19 DIAGNOSIS — K921 Melena: Secondary | ICD-10-CM | POA: Diagnosis not present

## 2014-01-19 DIAGNOSIS — K279 Peptic ulcer, site unspecified, unspecified as acute or chronic, without hemorrhage or perforation: Secondary | ICD-10-CM | POA: Diagnosis not present

## 2014-02-19 DIAGNOSIS — J029 Acute pharyngitis, unspecified: Secondary | ICD-10-CM | POA: Diagnosis not present

## 2014-05-11 DIAGNOSIS — E559 Vitamin D deficiency, unspecified: Secondary | ICD-10-CM | POA: Diagnosis not present

## 2014-05-11 DIAGNOSIS — Z0000 Encounter for general adult medical examination without abnormal findings: Secondary | ICD-10-CM | POA: Diagnosis not present

## 2014-05-11 DIAGNOSIS — R7309 Other abnormal glucose: Secondary | ICD-10-CM | POA: Diagnosis not present

## 2014-05-11 DIAGNOSIS — M899 Disorder of bone, unspecified: Secondary | ICD-10-CM | POA: Diagnosis not present

## 2014-05-11 DIAGNOSIS — Z23 Encounter for immunization: Secondary | ICD-10-CM | POA: Diagnosis not present

## 2014-05-11 DIAGNOSIS — E780 Pure hypercholesterolemia: Secondary | ICD-10-CM | POA: Diagnosis not present

## 2014-06-04 DIAGNOSIS — M8589 Other specified disorders of bone density and structure, multiple sites: Secondary | ICD-10-CM | POA: Diagnosis not present

## 2014-06-04 DIAGNOSIS — M859 Disorder of bone density and structure, unspecified: Secondary | ICD-10-CM | POA: Diagnosis not present

## 2014-06-19 DIAGNOSIS — L255 Unspecified contact dermatitis due to plants, except food: Secondary | ICD-10-CM | POA: Diagnosis not present

## 2014-06-19 DIAGNOSIS — H6122 Impacted cerumen, left ear: Secondary | ICD-10-CM | POA: Diagnosis not present

## 2014-06-19 DIAGNOSIS — J029 Acute pharyngitis, unspecified: Secondary | ICD-10-CM | POA: Diagnosis not present

## 2014-06-19 DIAGNOSIS — J069 Acute upper respiratory infection, unspecified: Secondary | ICD-10-CM | POA: Diagnosis not present

## 2014-08-24 DIAGNOSIS — J40 Bronchitis, not specified as acute or chronic: Secondary | ICD-10-CM | POA: Diagnosis not present

## 2014-08-24 DIAGNOSIS — J029 Acute pharyngitis, unspecified: Secondary | ICD-10-CM | POA: Diagnosis not present

## 2014-09-10 ENCOUNTER — Other Ambulatory Visit (HOSPITAL_COMMUNITY): Payer: Self-pay | Admitting: Family Medicine

## 2014-09-10 DIAGNOSIS — Z1231 Encounter for screening mammogram for malignant neoplasm of breast: Secondary | ICD-10-CM

## 2014-09-11 DIAGNOSIS — K219 Gastro-esophageal reflux disease without esophagitis: Secondary | ICD-10-CM | POA: Diagnosis not present

## 2014-09-11 DIAGNOSIS — J40 Bronchitis, not specified as acute or chronic: Secondary | ICD-10-CM | POA: Diagnosis not present

## 2014-09-11 DIAGNOSIS — R079 Chest pain, unspecified: Secondary | ICD-10-CM | POA: Diagnosis not present

## 2014-09-11 DIAGNOSIS — R091 Pleurisy: Secondary | ICD-10-CM | POA: Diagnosis not present

## 2014-10-03 ENCOUNTER — Ambulatory Visit (HOSPITAL_COMMUNITY)
Admission: RE | Admit: 2014-10-03 | Discharge: 2014-10-03 | Disposition: A | Discharge status: Home / Self Care | Payer: Medicare Other | Source: Ambulatory Visit | Attending: Family Medicine | Admitting: Family Medicine

## 2014-10-03 DIAGNOSIS — Z1231 Encounter for screening mammogram for malignant neoplasm of breast: Secondary | ICD-10-CM | POA: Insufficient documentation

## 2014-10-31 DIAGNOSIS — Z23 Encounter for immunization: Secondary | ICD-10-CM | POA: Diagnosis not present

## 2015-01-01 DIAGNOSIS — H25013 Cortical age-related cataract, bilateral: Secondary | ICD-10-CM | POA: Diagnosis not present

## 2015-01-01 DIAGNOSIS — H2513 Age-related nuclear cataract, bilateral: Secondary | ICD-10-CM | POA: Diagnosis not present

## 2015-05-22 DIAGNOSIS — Z1211 Encounter for screening for malignant neoplasm of colon: Secondary | ICD-10-CM | POA: Diagnosis not present

## 2015-05-22 DIAGNOSIS — Z1389 Encounter for screening for other disorder: Secondary | ICD-10-CM | POA: Diagnosis not present

## 2015-05-22 DIAGNOSIS — D069 Carcinoma in situ of cervix, unspecified: Secondary | ICD-10-CM | POA: Diagnosis not present

## 2015-05-22 DIAGNOSIS — E559 Vitamin D deficiency, unspecified: Secondary | ICD-10-CM | POA: Diagnosis not present

## 2015-05-22 DIAGNOSIS — M899 Disorder of bone, unspecified: Secondary | ICD-10-CM | POA: Diagnosis not present

## 2015-05-22 DIAGNOSIS — Z23 Encounter for immunization: Secondary | ICD-10-CM | POA: Diagnosis not present

## 2015-05-22 DIAGNOSIS — E7800 Pure hypercholesterolemia, unspecified: Secondary | ICD-10-CM | POA: Diagnosis not present

## 2015-05-22 DIAGNOSIS — R7309 Other abnormal glucose: Secondary | ICD-10-CM | POA: Diagnosis not present

## 2015-05-22 DIAGNOSIS — Z0000 Encounter for general adult medical examination without abnormal findings: Secondary | ICD-10-CM | POA: Diagnosis not present

## 2015-05-23 ENCOUNTER — Encounter (INDEPENDENT_AMBULATORY_CARE_PROVIDER_SITE_OTHER): Payer: Self-pay | Admitting: *Deleted

## 2015-05-30 ENCOUNTER — Encounter (INDEPENDENT_AMBULATORY_CARE_PROVIDER_SITE_OTHER): Payer: Self-pay | Admitting: *Deleted

## 2015-05-30 ENCOUNTER — Other Ambulatory Visit (INDEPENDENT_AMBULATORY_CARE_PROVIDER_SITE_OTHER): Payer: Self-pay | Admitting: *Deleted

## 2015-05-30 ENCOUNTER — Encounter (INDEPENDENT_AMBULATORY_CARE_PROVIDER_SITE_OTHER): Payer: Self-pay | Admitting: Family Medicine

## 2015-05-30 DIAGNOSIS — Z1211 Encounter for screening for malignant neoplasm of colon: Secondary | ICD-10-CM

## 2015-06-05 ENCOUNTER — Encounter (HOSPITAL_COMMUNITY): Payer: Self-pay | Admitting: *Deleted

## 2015-06-05 ENCOUNTER — Emergency Department (HOSPITAL_COMMUNITY)
Admission: EM | Admit: 2015-06-05 | Discharge: 2015-06-05 | Disposition: A | Discharge status: Home / Self Care | Payer: Medicare Other | Attending: Emergency Medicine | Admitting: Emergency Medicine

## 2015-06-05 DIAGNOSIS — X58XXXA Exposure to other specified factors, initial encounter: Secondary | ICD-10-CM | POA: Diagnosis not present

## 2015-06-05 DIAGNOSIS — S0501XA Injury of conjunctiva and corneal abrasion without foreign body, right eye, initial encounter: Secondary | ICD-10-CM | POA: Diagnosis not present

## 2015-06-05 DIAGNOSIS — Z79899 Other long term (current) drug therapy: Secondary | ICD-10-CM | POA: Diagnosis not present

## 2015-06-05 DIAGNOSIS — Y999 Unspecified external cause status: Secondary | ICD-10-CM | POA: Insufficient documentation

## 2015-06-05 DIAGNOSIS — Y9389 Activity, other specified: Secondary | ICD-10-CM | POA: Insufficient documentation

## 2015-06-05 DIAGNOSIS — Y929 Unspecified place or not applicable: Secondary | ICD-10-CM | POA: Insufficient documentation

## 2015-06-05 DIAGNOSIS — S058X1A Other injuries of right eye and orbit, initial encounter: Secondary | ICD-10-CM | POA: Diagnosis present

## 2015-06-05 MED ORDER — FLUORESCEIN SODIUM 1 MG OP STRP
1.0000 | ORAL_STRIP | Freq: Once | OPHTHALMIC | Status: AC
  Administered 2015-06-05: via OPHTHALMIC

## 2015-06-05 MED ORDER — ERYTHROMYCIN 5 MG/GM OP OINT: TOPICAL_OINTMENT | OPHTHALMIC | Status: DC

## 2015-06-05 MED ORDER — TETRACAINE HCL 0.5 % OP SOLN
OPHTHALMIC | Status: AC
  Filled 2015-06-05: qty 4

## 2015-06-05 MED ORDER — TETRACAINE HCL 0.5 % OP SOLN
1.0000 [drp] | Freq: Once | OPHTHALMIC | Status: AC
  Administered 2015-06-05: 1 [drp] via OPHTHALMIC

## 2015-06-05 MED ORDER — FLUORESCEIN SODIUM 1 MG OP STRP
ORAL_STRIP | OPHTHALMIC | Status: AC
  Filled 2015-06-05: qty 1

## 2015-06-05 NOTE — ED Provider Notes (Signed)
CSN: [id number]     Arrival date & time 06/05/15  2211 History   By signing my name below, I, Arianna Nassar and Meriel Flavors attest that this documentation has been prepared under the direction and in the presence of Forde Dandy, MD. Electronically Signed: Julien Nordmann, ED Scribe. 06/05/2015. 11:26 PM.    Chief Complaint  Patient presents with  . Foreign Body in Eye      The history is provided by the patient. No language interpreter was used.  HPI Comments: Jaime Lin is a 75 y.o. female who presents to the Emergency Department complaining of sudden onset, gradual worsening, moderate foreign body sensation in the right eye onset this evening. She reports associated tearing and blurry vision in her right eye. Pt states she was grabbing a towel and believed something fell into her right eye. Pt states her pain increases when she moves her eye to the right side. She has used sterile eyedrops and water to try to rinse it out with no relief. Pt denies any other symptoms.   History reviewed. No pertinent past medical history. Past Surgical History  Procedure Laterality Date  . Abdominal hysterectomy    . Appendectomy    . Cholecystectomy     History reviewed. No pertinent family history. Social History  Substance Use Topics  . Smoking status: Never Smoker   . Smokeless tobacco: None  . Alcohol Use: No   OB History    No data available     Review of Systems  Eyes: Positive for pain, discharge, redness and visual disturbance (blurry vision).  All other systems reviewed and are negative.     Allergies  Voltaren  Home Medications   Prior to Admission medications   Medication Sig Start Date End Date Taking? Authorizing Provider  Calcium Carb-Cholecalciferol (CALCIUM 1000 + D PO) Take 1 tablet by mouth daily.   Yes Historical Provider, MD  Cholecalciferol (VITAMIN D) 2000 UNITS CAPS Take 1 capsule by mouth daily.   Yes Historical Provider, MD  Multiple Vitamin (MULTIVITAMIN  WITH MINERALS) TABS tablet Take 1 tablet by mouth daily.   Yes Historical Provider, MD  Naphazoline-Polyethyl Glycol 0.012-0.2 % SOLN Apply 1-2 drops to eye daily as needed (for eye irritation).   Yes Historical Provider, MD  Pseudoephedrine-APAP-DM (DAYQUIL PO) Take 2 capsules by mouth daily as needed (for cold symptoms).   Yes Historical Provider, MD  erythromycin ophthalmic ointment Place a 1/2 inch ribbon of ointment into the lower eyelid four times a day for 3 to 5 days (until corneal abrasion resolves) 06/05/15   Forde Dandy, MD   Triage vitals: BP 212/91 mmHg  Pulse 67  Temp(Src) 97.9 F (36.6 C) (Oral)  Resp 18  Ht 5\' 7"  (1.702 m)  Wt 194 lb (87.998 kg)  BMI 30.38 kg/m2  SpO2 98% Physical Exam Physical Exam  Nursing note and vitals reviewed. Constitutional: Well developed, well nourished, non-toxic, and in no acute distress Head: Normocephalic and atraumatic.  Mouth/Throat: Oropharynx is clear and moist.  Eyes: Corneal abrasion at the 10' O clock position of the right cornea. PERRL. EOMI. With eyelid eversion of the right eye, no foreign body noted.  Neck: Normal range of motion. Neck supple.  Cardiovascular: Normal rate and regular rhythm.   Pulmonary/Chest: Effort normal  Musculoskeletal: Normal range of motion.  Neurological: Alert, no facial droop, fluent speech, moves all extremities symmetrically Skin: Skin is warm and dry.  Psychiatric: Cooperative  ED Course  Procedures (including critical care  time) DIAGNOSTIC STUDIES: Oxygen Saturation is 98% on RA, normal by my interpretation.  COORDINATION OF CARE: 10:59 PM Numbing of the eye. Discussed treatment plan which includes following up with eye doctor with pt at bedside and pt agreed to plan.  Labs Review Labs Reviewed - No data to display  Imaging Review No results found. I have personally reviewed and evaluated these images and lab results as part of my medical decision-making.   EKG Interpretation None       MDM   Final diagnoses:  Corneal abrasion, right, initial encounter   75 year old female who presents with foreign body sensation to the right eye with increased tearing. She does not wear contact lenses. Visual acuity intact. Evidence of corneal abrasion involving the right eye. No evidence of foreign body on eyelid eversion. Discussed follow-up with eye physician within one to 2 days. Prescribed erythromycin ointment for treatment. Strict return and follow-up instructions reviewed. She expressed understanding of all discharge instructions and felt comfortable with the plan of care.   I personally performed the services described in this documentation, which was scribed in my presence. The recorded information has been reviewed and is accurate.   Forde Dandy, MD 06/05/15 [phone]

## 2015-06-05 NOTE — ED Notes (Signed)
Pt c/o feeling like something is in her right eye; pt states she was getting towel down off rack and something flew in her eye; pt states it is making her nose run

## 2015-06-05 NOTE — Discharge Instructions (Signed)
Most corneal abrasions heal within 24-72 hours. We recommend follow-up with your eye doctor in 1-2 days to make sure you are healing appropriately. Take motrin and tylenol as needed for pain control.   Corneal Abrasion The cornea is the clear covering at the front and center of the eye. When looking at the colored portion of the eye (iris), you are looking through the cornea. This very thin tissue is made up of many layers. The surface layer is a single layer of cells (corneal epithelium) and is one of the most sensitive tissues in the body. If a scratch or injury causes the corneal epithelium to come off, it is called a corneal abrasion. If the injury extends to the tissues below the epithelium, the condition is called a corneal ulcer. CAUSES   Scratches.  Trauma.  Foreign body in the eye. Some people have recurrences of abrasions in the area of the original injury even after it has healed (recurrent erosion syndrome). Recurrent erosion syndrome generally improves and goes away with time. SYMPTOMS   Eye pain.  Difficulty or inability to keep the injured eye open.  The eye becomes very sensitive to light.  Recurrent erosions tend to happen suddenly, first thing in the morning, usually after waking up and opening the eye. DIAGNOSIS  Your health care provider can diagnose a corneal abrasion during an eye exam. Dye is usually placed in the eye using a drop or a small paper strip moistened by your tears. When the eye is examined with a special light, the abrasion shows up clearly because of the dye. TREATMENT   Small abrasions may be treated with antibiotic drops or ointment alone.  A pressure patch may be put over the eye. If this is done, follow your doctor's instructions for when to remove the patch. Do not drive or use machines while the eye patch is on. Judging distances is hard to do with a patch on. If the abrasion becomes infected and spreads to the deeper tissues of the cornea, a  corneal ulcer can result. This is serious because it can cause corneal scarring. Corneal scars interfere with light passing through the cornea and cause a loss of vision in the involved eye. HOME CARE INSTRUCTIONS  Use medicine or ointment as directed. Only take over-the-counter or prescription medicines for pain, discomfort, or fever as directed by your health care provider.  Do not drive or operate machinery if your eye is patched. Your ability to judge distances is impaired.  If your health care provider has given you a follow-up appointment, it is very important to keep that appointment. Not keeping the appointment could result in a severe eye infection or permanent loss of vision. If there is any problem keeping the appointment, let your health care provider know. SEEK MEDICAL CARE IF:   You have pain, light sensitivity, and a scratchy feeling in one eye or both eyes.  Your pressure patch keeps loosening up, and you can blink your eye under the patch after treatment.  Any kind of discharge develops from the eye after treatment or if the lids stick together in the morning.  You have the same symptoms in the morning as you did with the original abrasion days, weeks, or months after the abrasion healed.   This information is not intended to replace advice given to you by your health care provider. Make sure you discuss any questions you have with your health care provider.   Document Released: 12/27/1999 Document Revised: 09/19/2014 Document  Reviewed: 09/05/2012 Elsevier Interactive Patient Education [employer].

## 2015-06-06 DIAGNOSIS — S0501XA Injury of conjunctiva and corneal abrasion without foreign body, right eye, initial encounter: Secondary | ICD-10-CM | POA: Diagnosis not present

## 2015-06-11 DIAGNOSIS — S0501XD Injury of conjunctiva and corneal abrasion without foreign body, right eye, subsequent encounter: Secondary | ICD-10-CM | POA: Diagnosis not present

## 2015-07-17 ENCOUNTER — Encounter (INDEPENDENT_AMBULATORY_CARE_PROVIDER_SITE_OTHER): Payer: Self-pay | Admitting: *Deleted

## 2015-07-17 ENCOUNTER — Other Ambulatory Visit (INDEPENDENT_AMBULATORY_CARE_PROVIDER_SITE_OTHER): Payer: Self-pay | Admitting: *Deleted

## 2015-07-17 NOTE — Telephone Encounter (Signed)
Patient needs trilyte 

## 2015-07-18 MED ORDER — PEG 3350-KCL-NA BICARB-NACL 420 G PO SOLR: 4000.0000 mL | Freq: Once | ORAL | Status: DC

## 2015-08-06 ENCOUNTER — Telehealth (INDEPENDENT_AMBULATORY_CARE_PROVIDER_SITE_OTHER): Payer: Self-pay | Admitting: *Deleted

## 2015-08-06 NOTE — Telephone Encounter (Signed)
Referring MD/PCP: ehinger   Procedure: tcs  Reason/Indication:  screening  Has patient had this procedure before?  Yes, 2007 -- scanned  If so, when, by whom and where?    Is there a family history of colon cancer?  no  Who?  What age when diagnosed?    Is patient diabetic?   no      Does patient have prosthetic heart valve or mechanical valve?  no  Do you have a pacemaker?  no  Has patient ever had endocarditis? no  Has patient had joint replacement within last 12 months?  no  Does patient tend to be constipated or take laxatives? no  Does patient have a history of alcohol/drug use?  no  Is patient on Coumadin, Plavix and/or Aspirin? no  Medications: one a day vit, calcium w/ vit d daily, vit d daily  Allergies: voltaren  Medication Adjustment:   Procedure date & time: 09/05/15 at 925

## 2015-08-06 NOTE — Telephone Encounter (Signed)
agree

## 2015-08-21 DIAGNOSIS — R110 Nausea: Secondary | ICD-10-CM | POA: Diagnosis not present

## 2015-08-21 DIAGNOSIS — K219 Gastro-esophageal reflux disease without esophagitis: Secondary | ICD-10-CM | POA: Diagnosis not present

## 2015-09-05 ENCOUNTER — Encounter (HOSPITAL_COMMUNITY)
Admission: RE | Disposition: A | Discharge status: Home / Self Care | Payer: Self-pay | Source: Ambulatory Visit | Attending: Internal Medicine

## 2015-09-05 ENCOUNTER — Ambulatory Visit (HOSPITAL_COMMUNITY)
Admission: RE | Admit: 2015-09-05 | Discharge: 2015-09-05 | Disposition: A | Discharge status: Home / Self Care | Payer: Medicare Other | Source: Ambulatory Visit | Attending: Internal Medicine | Admitting: Internal Medicine

## 2015-09-05 ENCOUNTER — Encounter (HOSPITAL_COMMUNITY): Payer: Self-pay | Admitting: *Deleted

## 2015-09-05 DIAGNOSIS — Z86010 Personal history of colonic polyps: Secondary | ICD-10-CM | POA: Diagnosis not present

## 2015-09-05 DIAGNOSIS — K644 Residual hemorrhoidal skin tags: Secondary | ICD-10-CM | POA: Diagnosis not present

## 2015-09-05 DIAGNOSIS — K635 Polyp of colon: Secondary | ICD-10-CM | POA: Insufficient documentation

## 2015-09-05 DIAGNOSIS — Z1211 Encounter for screening for malignant neoplasm of colon: Secondary | ICD-10-CM | POA: Diagnosis not present

## 2015-09-05 DIAGNOSIS — D120 Benign neoplasm of cecum: Secondary | ICD-10-CM | POA: Diagnosis not present

## 2015-09-05 DIAGNOSIS — Z79899 Other long term (current) drug therapy: Secondary | ICD-10-CM | POA: Insufficient documentation

## 2015-09-05 HISTORY — PX: COLONOSCOPY: SHX5424

## 2015-09-05 SURGERY — COLONOSCOPY
Anesthesia: Moderate Sedation

## 2015-09-05 MED ORDER — MEPERIDINE HCL 50 MG/ML IJ SOLN
INTRAMUSCULAR | Status: AC
  Filled 2015-09-05: qty 1

## 2015-09-05 MED ORDER — MEPERIDINE HCL 50 MG/ML IJ SOLN
INTRAMUSCULAR | Status: DC | PRN
  Administered 2015-09-05 (×2): 25 mg via INTRAVENOUS

## 2015-09-05 MED ORDER — SODIUM CHLORIDE 0.9 % IV SOLN
INTRAVENOUS | Status: DC
  Administered 2015-09-05: 1000 mL via INTRAVENOUS

## 2015-09-05 MED ORDER — MIDAZOLAM HCL 5 MG/5ML IJ SOLN
INTRAMUSCULAR | Status: AC
  Filled 2015-09-05: qty 10

## 2015-09-05 MED ORDER — MIDAZOLAM HCL 5 MG/5ML IJ SOLN
INTRAMUSCULAR | Status: DC | PRN
  Administered 2015-09-05 (×2): 2 mg via INTRAVENOUS
  Administered 2015-09-05: 1 mg via INTRAVENOUS

## 2015-09-05 NOTE — Op Note (Signed)
[facility] Patient Name: Jaime Lin Procedure Date: 09/05/2015 9:12 AM MRN: [id number] Date of Birth: 09-27-1940 Attending MD: Hildred Laser , MD CSN: [id number] Age: 75 Admit Type: Outpatient Procedure:                Colonoscopy Indications:              Screening for colorectal malignant neoplasm Providers:                Hildred Laser, MD, Rosina Lowenstein, RN, Purcell Nails.                            Tina Griffiths, Technician Referring MD:             Gaynelle Arabian, MD Medicines:                Meperidine 50 mg IV, Midazolam 5 mg IV Complications:            No immediate complications. Estimated Blood Loss:     Estimated blood loss was minimal. Procedure:                Pre-Anesthesia Assessment:                           - Prior to the procedure, a History and Physical                            was performed, and patient medications and                            allergies were reviewed. The patient's tolerance of                            previous anesthesia was also reviewed. The risks                            and benefits of the procedure and the sedation                            options and risks were discussed with the patient.                            All questions were answered, and informed consent                            was obtained. Prior Anticoagulants: The patient has                            taken no previous anticoagulant or antiplatelet                            agents. ASA Grade Assessment: I - A normal, healthy                            patient. After reviewing the risks and benefits,  the patient was deemed in satisfactory condition to                            undergo the procedure.                           After obtaining informed consent, the colonoscope                            was passed under direct vision. Throughout the                            procedure, the patient's blood pressure, pulse, and            oxygen saturations were monitored continuously. The                            [id number]) scope was introduced through                            the anus and advanced to the the cecum, identified                            by appendiceal orifice and ileocecal valve. The                            colonoscopy was performed without difficulty. The                            patient tolerated the procedure well. The quality                            of the bowel preparation was excellent. The                            ileocecal valve, appendiceal orifice, and rectum                            were photographed. Scope In: 9:23:08 AM Scope Out: 9:44:13 AM Scope Withdrawal Time: 0 hours 13 minutes 11 seconds  Total Procedure Duration: 0 hours 21 minutes 5 seconds  Findings:      A 5 mm polyp was found in the cecum. The polyp was sessile. The polyp       was removed with a cold snare. Resection and retrieval were complete.      The exam was otherwise without abnormality.      Non-bleeding external hemorrhoids were found during retroflexion. The       hemorrhoids were small. Impression:               - One 5 mm polyp in the cecum, removed with a cold                            snare. Resected and retrieved.                           -  The examination was otherwise normal.                           - Non-bleeding external hemorrhoids. Moderate Sedation:      Moderate (conscious) sedation was administered by the endoscopy nurse       and supervised by the endoscopist. The following parameters were       monitored: oxygen saturation, heart rate, blood pressure, CO2       capnography and response to care. Total physician intraservice time was       27 minutes. Recommendation:           - Patient has a contact number available for                            emergencies. The signs and symptoms of potential                            delayed complications were discussed with the                             patient. Return to normal activities tomorrow.                            Written discharge instructions were provided to the                            patient.                           - Resume previous diet today.                           - Continue present medications.                           - Await pathology results.                           - Repeat colonoscopy is recommended [Repeat                            reason]. The colonoscopy date will be determined                            after pathology results from today's exam become                            available for review. Procedure Code(s):        --- Professional ---                           [phone], Colonoscopy, flexible; with removal of                            tumor(s), polyp(s), or other lesion(s) by snare  technique                           [phone], Moderate sedation services provided by the                            same physician or other qualified health care                            professional performing the diagnostic or                            therapeutic service that the sedation supports,                            requiring the presence of an independent trained                            observer to assist in the monitoring of the                            patient's level of consciousness and physiological                            status; initial 15 minutes of intraservice time,                            patient age 45 years or older                           [phone], Moderate sedation services; each additional                            15 minutes intraservice time Diagnosis Code(s):        --- Professional ---                           Z12.11, Encounter for screening for malignant                            neoplasm of colon                           D12.0, Benign neoplasm of cecum                           K64.4, Residual hemorrhoidal  skin tags CPT copyright 2016 American Medical Association. All rights reserved. The codes documented in this report are preliminary and upon coder review may  be revised to meet current compliance requirements. Hildred Laser, MD Hildred Laser, MD 09/05/2015 9:51:20 AM This report has been signed electronically. Number of Addenda: 0

## 2015-09-05 NOTE — Discharge Instructions (Signed)
Resume usual medications and diet. °No driving for 24 hours. °Physician will call with biopsy results.Colonoscopy, Care After °Refer to this sheet in the next few weeks. These instructions provide you with information on caring for yourself after your procedure. Your health care provider may also give you more specific instructions. Your treatment has been planned according to current medical practices, but problems sometimes occur. Call your health care provider if you have any problems or questions after your procedure. °WHAT TO EXPECT AFTER THE PROCEDURE  °After your procedure, it is typical to have the following: °· A small amount of blood in your stool. °· Moderate amounts of gas and mild abdominal cramping or bloating. °HOME CARE INSTRUCTIONS °· Do not drive, operate machinery, or sign important documents for 24 hours. °· You may shower and resume your regular physical activities, but move at a slower pace for the first 24 hours. °· Take frequent rest periods for the first 24 hours. °· Walk around or put a warm pack on your abdomen to help reduce abdominal cramping and bloating. °· Drink enough fluids to keep your urine clear or pale yellow. °· You may resume your normal diet as instructed by your health care provider. Avoid heavy or fried foods that are hard to digest. °· Avoid drinking alcohol for 24 hours or as instructed by your health care provider. °· Only take over-the-counter or prescription medicines as directed by your health care provider. °· If a tissue sample (biopsy) was taken during your procedure: °¨ Do not take aspirin or blood thinners for 7 days, or as instructed by your health care provider. °¨ Do not drink alcohol for 7 days, or as instructed by your health care provider. °¨ Eat soft foods for the first 24 hours. °SEEK MEDICAL CARE IF: °You have persistent spotting of blood in your stool 2-3 days after the procedure. °SEEK IMMEDIATE MEDICAL CARE IF: °· You have more than a small spotting  of blood in your stool. °· You pass large blood clots in your stool. °· Your abdomen is swollen (distended). °· You have nausea or vomiting. °· You have a fever. °· You have increasing abdominal pain that is not relieved with medicine. °  °This information is not intended to replace advice given to you by your health care provider. Make sure you discuss any questions you have with your health care provider. °  °Document Released: 08/13/2003 Document Revised: 10/19/2012 Document Reviewed: 09/05/2012 °Elsevier Interactive Patient Education ©2016 [employer]vier [employer] ° °

## 2015-09-05 NOTE — H&P (Signed)
Zaliyah Mueth Pauley is an 75 y.o. female.   Chief Complaint: Patient is here for colonoscopy. HPI: Patient is 75 year old Caucasian female who is here for screening colonoscopy. She denies abdominal pain change in bowel habits or rectal bleeding. Last colonoscopy was 10 years ago with removal of single polyp is not an adenoma. Family history is negative for CRC.  Past medical history: GERD  Past Surgical History:  Procedure Laterality Date  . ABDOMINAL HYSTERECTOMY    . APPENDECTOMY    . CHOLECYSTECTOMY    . COLONOSCOPY      Family History  Problem Relation Age of Onset  . Cirrhosis Mother   . Hypertension Father   . Stomach cancer Father   . Coronary artery disease Sister   . Prostate cancer Brother   . Sleep apnea Brother   . Breast cancer Sister    Social History:  reports that she has never smoked. She has never used smokeless tobacco. She reports that she does not drink alcohol or use drugs.  Allergies:  Allergies  Allergen Reactions  . Voltaren [Diclofenac Sodium]     Medications Prior to Admission  Medication Sig Dispense Refill  . pantoprazole (PROTONIX) 40 MG tablet Take 40 mg by mouth daily.    . polyethylene glycol-electrolytes (TRILYTE) 420 g solution Take 4,000 mLs by mouth once. 4000 mL 0  . Calcium Carb-Cholecalciferol (CALCIUM 1000 + D PO) Take 1 tablet by mouth daily.    . Cholecalciferol (VITAMIN D) 2000 UNITS CAPS Take 1 capsule by mouth daily.    Marland Kitchen erythromycin ophthalmic ointment Place a 1/2 inch ribbon of ointment into the lower eyelid four times a day for 3 to 5 days (until corneal abrasion resolves) 3.5 g 0  . Multiple Vitamin (MULTIVITAMIN WITH MINERALS) TABS tablet Take 1 tablet by mouth daily.    . Naphazoline-Polyethyl Glycol 0.012-0.2 % SOLN Apply 1-2 drops to eye daily as needed (for eye irritation).    . Pseudoephedrine-APAP-DM (DAYQUIL PO) Take 2 capsules by mouth daily as needed (for cold symptoms).      No results found for this or any previous  visit (from the past 48 hour(s)). No results found.  ROS  Blood pressure (!) 196/90, pulse 64, temperature 98 F (36.7 C), temperature source Oral, resp. rate 20, height 5\' 7"  (1.702 m), weight 188 lb (85.3 kg), SpO2 96 %. Physical Exam  Constitutional: She appears well-developed and well-nourished.  HENT:  Mouth/Throat: Oropharynx is clear and moist.  Eyes: Conjunctivae are normal. No scleral icterus.  Neck: No thyromegaly present.  Cardiovascular: Normal rate, regular rhythm and normal heart sounds.   No murmur heard. Respiratory: Effort normal and breath sounds normal.  GI: Soft. She exhibits no distension and no mass. There is no tenderness.  Right paramedian scar located laterally.  Musculoskeletal: She exhibits no edema.  Lymphadenopathy:    She has no cervical adenopathy.  Neurological: She is alert.  Skin: Skin is warm and dry.     Assessment/Plan Average risk screening colonoscopy.  Hildred Laser, MD 09/05/2015, 9:13 AM

## 2015-09-09 ENCOUNTER — Encounter (HOSPITAL_COMMUNITY): Payer: Self-pay | Admitting: Internal Medicine

## 2015-10-03 ENCOUNTER — Other Ambulatory Visit (HOSPITAL_COMMUNITY): Payer: Self-pay | Admitting: Family Medicine

## 2015-10-03 DIAGNOSIS — Z1231 Encounter for screening mammogram for malignant neoplasm of breast: Secondary | ICD-10-CM

## 2015-10-10 ENCOUNTER — Ambulatory Visit (HOSPITAL_COMMUNITY): Payer: Medicare Other

## 2015-10-17 ENCOUNTER — Ambulatory Visit (HOSPITAL_COMMUNITY)
Admission: RE | Admit: 2015-10-17 | Discharge: 2015-10-17 | Disposition: A | Discharge status: Home / Self Care | Payer: Medicare Other | Source: Ambulatory Visit | Attending: Family Medicine | Admitting: Family Medicine

## 2015-10-17 DIAGNOSIS — Z1231 Encounter for screening mammogram for malignant neoplasm of breast: Secondary | ICD-10-CM | POA: Insufficient documentation

## 2015-10-25 DIAGNOSIS — Z23 Encounter for immunization: Secondary | ICD-10-CM | POA: Diagnosis not present

## 2016-02-11 DIAGNOSIS — H524 Presbyopia: Secondary | ICD-10-CM | POA: Diagnosis not present

## 2016-02-11 DIAGNOSIS — H25013 Cortical age-related cataract, bilateral: Secondary | ICD-10-CM | POA: Diagnosis not present

## 2016-02-11 DIAGNOSIS — H5212 Myopia, left eye: Secondary | ICD-10-CM | POA: Diagnosis not present

## 2016-02-11 DIAGNOSIS — H2513 Age-related nuclear cataract, bilateral: Secondary | ICD-10-CM | POA: Diagnosis not present

## 2016-02-11 DIAGNOSIS — H52202 Unspecified astigmatism, left eye: Secondary | ICD-10-CM | POA: Diagnosis not present

## 2016-02-27 DIAGNOSIS — J40 Bronchitis, not specified as acute or chronic: Secondary | ICD-10-CM | POA: Diagnosis not present

## 2016-02-27 DIAGNOSIS — J069 Acute upper respiratory infection, unspecified: Secondary | ICD-10-CM | POA: Diagnosis not present

## 2016-05-25 DIAGNOSIS — Z8541 Personal history of malignant neoplasm of cervix uteri: Secondary | ICD-10-CM | POA: Diagnosis not present

## 2016-05-25 DIAGNOSIS — Z0000 Encounter for general adult medical examination without abnormal findings: Secondary | ICD-10-CM | POA: Diagnosis not present

## 2016-05-25 DIAGNOSIS — R7309 Other abnormal glucose: Secondary | ICD-10-CM | POA: Diagnosis not present

## 2016-05-25 DIAGNOSIS — Z1389 Encounter for screening for other disorder: Secondary | ICD-10-CM | POA: Diagnosis not present

## 2016-05-25 DIAGNOSIS — E7800 Pure hypercholesterolemia, unspecified: Secondary | ICD-10-CM | POA: Diagnosis not present

## 2016-05-25 DIAGNOSIS — E559 Vitamin D deficiency, unspecified: Secondary | ICD-10-CM | POA: Diagnosis not present

## 2016-05-25 DIAGNOSIS — M899 Disorder of bone, unspecified: Secondary | ICD-10-CM | POA: Diagnosis not present

## 2016-07-09 DIAGNOSIS — M8588 Other specified disorders of bone density and structure, other site: Secondary | ICD-10-CM | POA: Diagnosis not present

## 2016-10-12 DIAGNOSIS — M461 Sacroiliitis, not elsewhere classified: Secondary | ICD-10-CM | POA: Diagnosis not present

## 2016-10-12 DIAGNOSIS — R300 Dysuria: Secondary | ICD-10-CM | POA: Diagnosis not present

## 2016-11-19 ENCOUNTER — Other Ambulatory Visit (HOSPITAL_COMMUNITY): Payer: Self-pay | Admitting: Family Medicine

## 2016-11-19 DIAGNOSIS — Z1231 Encounter for screening mammogram for malignant neoplasm of breast: Secondary | ICD-10-CM

## 2016-11-26 ENCOUNTER — Encounter (HOSPITAL_COMMUNITY): Payer: Self-pay

## 2016-11-26 ENCOUNTER — Ambulatory Visit (HOSPITAL_COMMUNITY)
Admission: RE | Admit: 2016-11-26 | Discharge: 2016-11-26 | Disposition: A | Discharge status: Home / Self Care | Payer: Medicare Other | Source: Ambulatory Visit | Attending: Family Medicine | Admitting: Family Medicine

## 2016-11-26 DIAGNOSIS — Z1231 Encounter for screening mammogram for malignant neoplasm of breast: Secondary | ICD-10-CM | POA: Insufficient documentation

## 2017-02-09 DIAGNOSIS — H2513 Age-related nuclear cataract, bilateral: Secondary | ICD-10-CM | POA: Diagnosis not present

## 2017-02-09 DIAGNOSIS — H25013 Cortical age-related cataract, bilateral: Secondary | ICD-10-CM | POA: Diagnosis not present

## 2017-02-25 ENCOUNTER — Other Ambulatory Visit: Payer: Self-pay

## 2017-02-25 ENCOUNTER — Emergency Department (HOSPITAL_COMMUNITY)
Admission: EM | Admit: 2017-02-25 | Discharge: 2017-02-25 | Disposition: A | Discharge status: Home / Self Care | Payer: Medicare Other | Attending: Emergency Medicine | Admitting: Emergency Medicine

## 2017-02-25 ENCOUNTER — Encounter (HOSPITAL_COMMUNITY): Payer: Self-pay | Admitting: Emergency Medicine

## 2017-02-25 DIAGNOSIS — Z7982 Long term (current) use of aspirin: Secondary | ICD-10-CM | POA: Diagnosis not present

## 2017-02-25 DIAGNOSIS — I10 Essential (primary) hypertension: Secondary | ICD-10-CM | POA: Insufficient documentation

## 2017-02-25 DIAGNOSIS — R51 Headache: Secondary | ICD-10-CM | POA: Insufficient documentation

## 2017-02-25 DIAGNOSIS — Z79899 Other long term (current) drug therapy: Secondary | ICD-10-CM | POA: Insufficient documentation

## 2017-02-25 HISTORY — DX: Gastro-esophageal reflux disease without esophagitis: K21.9

## 2017-02-25 HISTORY — DX: Peptic ulcer, site unspecified, unspecified as acute or chronic, without hemorrhage or perforation: K27.9

## 2017-02-25 MED ORDER — LORAZEPAM 1 MG PO TABS
1.0000 mg | ORAL_TABLET | Freq: Once | ORAL | Status: AC
  Administered 2017-02-25: 1 mg via ORAL
  Filled 2017-02-25: qty 1

## 2017-02-25 MED ORDER — LISINOPRIL 20 MG PO TABS: 20.0000 mg | ORAL_TABLET | Freq: Every day | ORAL | 0 refills | Status: DC

## 2017-02-25 NOTE — Discharge Instructions (Signed)
Follow-up with your family doctor next week for recheck. 

## 2017-02-25 NOTE — ED Triage Notes (Signed)
Pt c/o increased acid reflux today with vomiting and states her blood pressure has been high.

## 2017-02-26 NOTE — ED Provider Notes (Signed)
[facility] EMERGENCY DEPARTMENT Provider Note   CSN: [id number] Arrival date & time: 02/25/17  2133     History   Chief Complaint Chief Complaint  Patient presents with  . Hypertension    HPI Jaime Lin is a 77 y.o. female.  Patient complains of a headache and high blood pressure.  She took 1 of her husband's blood pressure medicine today.  Her headaches improved   The history is provided by the patient.  Hypertension  This is a new problem. The current episode started 12 to 24 hours ago. The problem occurs constantly. The problem has not changed since onset.Pertinent negatives include no chest pain, no abdominal pain and no headaches. Nothing aggravates the symptoms. Nothing relieves the symptoms. She has tried nothing for the symptoms. The treatment provided no relief.    Past Medical History:  Diagnosis Date  . Acid reflux   . PUD (peptic ulcer disease)     There are no active problems to display for this patient.   Past Surgical History:  Procedure Laterality Date  . ABDOMINAL HYSTERECTOMY    . APPENDECTOMY    . CHOLECYSTECTOMY    . COLONOSCOPY    . COLONOSCOPY N/A 09/05/2015   Procedure: COLONOSCOPY;  Surgeon: Rogene Houston, MD;  Location: [facility];  Service: Endoscopy;  Laterality: N/A;  925    OB History    No data available       Home Medications    Prior to Admission medications   Medication Sig Start Date End Date Taking? Authorizing Provider  aspirin EC 81 MG tablet Take 162 mg by mouth once as needed.   Yes [provider]  Calcium Carbonate-Vitamin D3 (CALCIUM 600-D) 600-400 MG-UNIT TABS Take 1 tablet by mouth daily.   Yes [provider]  Cholecalciferol (VITAMIN D) 2000 UNITS CAPS Take 1 capsule by mouth daily.   Yes [provider]  Multiple Vitamin (MULTIVITAMIN WITH MINERALS) TABS tablet Take 1 tablet by mouth daily.   Yes [provider]  omeprazole (PRILOSEC) 20 MG capsule Take 20 mg by mouth  once as needed (indigestion).    Yes [provider]  pantoprazole (PROTONIX) 40 MG tablet Take 40 mg by mouth once as needed.   Yes [provider]  lisinopril (PRINIVIL,ZESTRIL) 20 MG tablet Take 1 tablet (20 mg total) by mouth daily. 02/25/17   Milton Ferguson, MD    Family History Family History  Problem Relation Age of Onset  . Cirrhosis Mother   . Hypertension Father   . Stomach cancer Father   . Coronary artery disease Sister   . Prostate cancer Brother   . Sleep apnea Brother   . Breast cancer Sister     Social History Social History   Tobacco Use  . Smoking status: Never Smoker  . Smokeless tobacco: Never Used  Substance Use Topics  . Alcohol use: No  . Drug use: No     Allergies   Voltaren [diclofenac sodium]   Review of Systems Review of Systems  Constitutional: Negative for appetite change and fatigue.  HENT: Negative for congestion, ear discharge and sinus pressure.        Headache  Eyes: Negative for discharge.  Respiratory: Negative for cough.   Cardiovascular: Negative for chest pain.  Gastrointestinal: Negative for abdominal pain and diarrhea.  Genitourinary: Negative for frequency and hematuria.  Musculoskeletal: Negative for back pain.  Skin: Negative for rash.  Neurological: Negative for seizures and headaches.  Psychiatric/Behavioral:  Negative for hallucinations.     Physical Exam Updated Vital Signs BP (!) 193/74   Pulse (!) 57   Temp 98 F (36.7 C) (Oral)   Resp 11   Ht 5\' 7"  (1.702 m)   Wt 81.6 kg (180 lb)   SpO2 97%   BMI 28.19 kg/m   Physical Exam  Constitutional: She is oriented to person, place, and time. She appears well-developed.  HENT:  Head: Normocephalic.  Eyes: Conjunctivae and EOM are normal. No scleral icterus.  Neck: Neck supple. No thyromegaly present.  Cardiovascular: Normal rate and regular rhythm. Exam reveals no gallop and no friction rub.  No murmur heard. Pulmonary/Chest: No stridor.  She has no wheezes. She has no rales. She exhibits no tenderness.  Abdominal: She exhibits no distension. There is no tenderness. There is no rebound.  Musculoskeletal: Normal range of motion. She exhibits no edema.  Lymphadenopathy:    She has no cervical adenopathy.  Neurological: She is oriented to person, place, and time. She exhibits normal muscle tone. Coordination normal.  Skin: No rash noted. No erythema.  Psychiatric: She has a normal mood and affect. Her behavior is normal.     ED Treatments / Results  Labs (all labs ordered are listed, but only abnormal results are displayed) Labs Reviewed - No data to display  EKG  EKG Interpretation None       Radiology No results found.  Procedures Procedures (including critical care time)  Medications Ordered in ED Medications  LORazepam (ATIVAN) tablet 1 mg (1 mg Oral Given 02/25/17 2234)     Initial Impression / Assessment and Plan / ED Course  I have reviewed the triage vital signs and the nursing notes.  Pertinent labs & imaging results that were available during my care of the patient were reviewed by me and considered in my medical decision making (see chart for details).     Patient with mild headache that has improved.  She also has new essential hypertension.  Patient will be placed on lisinopril and follow-up with her PCP Final Clinical Impressions(s) / ED Diagnoses   Final diagnoses:  Essential hypertension    ED Discharge Orders        Ordered    lisinopril (PRINIVIL,ZESTRIL) 20 MG tablet  Daily     02/25/17 2324       Milton Ferguson, MD 02/26/17 1503

## 2017-03-04 DIAGNOSIS — I10 Essential (primary) hypertension: Secondary | ICD-10-CM | POA: Diagnosis not present

## 2017-03-18 DIAGNOSIS — I10 Essential (primary) hypertension: Secondary | ICD-10-CM | POA: Diagnosis not present

## 2017-04-28 DIAGNOSIS — H2511 Age-related nuclear cataract, right eye: Secondary | ICD-10-CM | POA: Diagnosis not present

## 2017-04-28 DIAGNOSIS — H25011 Cortical age-related cataract, right eye: Secondary | ICD-10-CM | POA: Diagnosis not present

## 2017-04-28 DIAGNOSIS — H25012 Cortical age-related cataract, left eye: Secondary | ICD-10-CM | POA: Diagnosis not present

## 2017-04-28 DIAGNOSIS — H2512 Age-related nuclear cataract, left eye: Secondary | ICD-10-CM | POA: Diagnosis not present

## 2017-05-05 DIAGNOSIS — H2512 Age-related nuclear cataract, left eye: Secondary | ICD-10-CM | POA: Diagnosis not present

## 2017-05-05 DIAGNOSIS — H2511 Age-related nuclear cataract, right eye: Secondary | ICD-10-CM | POA: Diagnosis not present

## 2017-05-05 DIAGNOSIS — H25012 Cortical age-related cataract, left eye: Secondary | ICD-10-CM | POA: Diagnosis not present

## 2017-05-27 DIAGNOSIS — R7309 Other abnormal glucose: Secondary | ICD-10-CM | POA: Diagnosis not present

## 2017-05-27 DIAGNOSIS — E559 Vitamin D deficiency, unspecified: Secondary | ICD-10-CM | POA: Diagnosis not present

## 2017-05-27 DIAGNOSIS — Z1389 Encounter for screening for other disorder: Secondary | ICD-10-CM | POA: Diagnosis not present

## 2017-05-27 DIAGNOSIS — K219 Gastro-esophageal reflux disease without esophagitis: Secondary | ICD-10-CM | POA: Diagnosis not present

## 2017-05-27 DIAGNOSIS — I10 Essential (primary) hypertension: Secondary | ICD-10-CM | POA: Diagnosis not present

## 2017-05-27 DIAGNOSIS — M899 Disorder of bone, unspecified: Secondary | ICD-10-CM | POA: Diagnosis not present

## 2017-05-27 DIAGNOSIS — Z0000 Encounter for general adult medical examination without abnormal findings: Secondary | ICD-10-CM | POA: Diagnosis not present

## 2017-05-27 DIAGNOSIS — E7800 Pure hypercholesterolemia, unspecified: Secondary | ICD-10-CM | POA: Diagnosis not present

## 2017-09-06 DIAGNOSIS — Z961 Presence of intraocular lens: Secondary | ICD-10-CM | POA: Diagnosis not present

## 2017-11-01 DIAGNOSIS — Z23 Encounter for immunization: Secondary | ICD-10-CM | POA: Diagnosis not present

## 2018-01-18 ENCOUNTER — Telehealth: Payer: 59 | Admitting: Physician Assistant

## 2018-01-18 DIAGNOSIS — J029 Acute pharyngitis, unspecified: Secondary | ICD-10-CM

## 2018-01-18 NOTE — Progress Notes (Unsigned)
Based on what you shared with me it looks like you have a serious condition that should be evaluated in a face to face office visit.  NOTE: If you entered your credit card information for this eVisit, you will not be charged. You may see a "hold" on your card for the $30 but that hold will drop off and you will not have a charge processed.  If you are having a true medical emergency please call 911.  If you need an urgent face to face visit, [facility] has four urgent care centers for your convenience.  If you need care fast and have a high deductible or no insurance consider:   [email or web address] to reserve your spot online an avoid wait times  [facility] [address], [address] [place], [place] [address] 8 am to 8 pm Monday-Friday 10 am to 4 pm Saturday-Sunday *Across the street from [employer]  [facility]  [address]  [place], [address] 8 am to 5 pm Monday-Friday * In the [facility] the [facility]   The following sites will take your  insurance:  . [facility]  [phone] Get Driving Directions Find a Provider at this Location  [address] [place], [place] [address] . 10 am to 8 pm Monday-Friday . 12 pm to 8 pm Saturday-Sunday   . [facility]  [phone] Get Driving Directions Find a Provider at this Location  [address], [address] [place],  [address] . 8 am to 8 pm Monday-Friday . 9 am to 6 pm Saturday . 11 am to 6 pm Sunday   .  [facility] [place]  [phone] Get Driving Directions  [address] [place],  [address] . 8 am to 8 pm Monday-Friday . 8 am to 4 pm Saturday-Sunday   Your e-visit answers were reviewed by a board certified advanced clinical practitioner to complete your personal care plan.  Thank you for using e-Visits.  

## 2018-02-02 ENCOUNTER — Other Ambulatory Visit (HOSPITAL_COMMUNITY): Payer: Self-pay | Admitting: Family Medicine

## 2018-02-02 DIAGNOSIS — Z1231 Encounter for screening mammogram for malignant neoplasm of breast: Secondary | ICD-10-CM

## 2018-02-17 ENCOUNTER — Ambulatory Visit (HOSPITAL_COMMUNITY): Payer: 59

## 2018-02-23 ENCOUNTER — Ambulatory Visit (HOSPITAL_COMMUNITY)
Admission: RE | Admit: 2018-02-23 | Discharge: 2018-02-23 | Disposition: A | Discharge status: Home / Self Care | Payer: Medicare Other | Source: Ambulatory Visit | Attending: Family Medicine | Admitting: Family Medicine

## 2018-02-23 DIAGNOSIS — Z1231 Encounter for screening mammogram for malignant neoplasm of breast: Secondary | ICD-10-CM | POA: Insufficient documentation

## 2018-09-02 ENCOUNTER — Telehealth: Payer: Self-pay | Admitting: Radiation Oncology

## 2018-09-02 NOTE — Telephone Encounter (Signed)
Received voicemail message requesting Jaime Lin call her back. Message forwarded appropriately.

## 2018-09-08 DIAGNOSIS — Z961 Presence of intraocular lens: Secondary | ICD-10-CM | POA: Diagnosis not present

## 2018-09-08 DIAGNOSIS — Z0100 Encounter for examination of eyes and vision without abnormal findings: Secondary | ICD-10-CM | POA: Diagnosis not present

## 2018-11-15 DIAGNOSIS — M899 Disorder of bone, unspecified: Secondary | ICD-10-CM | POA: Diagnosis not present

## 2018-11-15 DIAGNOSIS — E7800 Pure hypercholesterolemia, unspecified: Secondary | ICD-10-CM | POA: Diagnosis not present

## 2018-11-15 DIAGNOSIS — E559 Vitamin D deficiency, unspecified: Secondary | ICD-10-CM | POA: Diagnosis not present

## 2018-11-15 DIAGNOSIS — K2100 Gastro-esophageal reflux disease with esophagitis, without bleeding: Secondary | ICD-10-CM | POA: Diagnosis not present

## 2018-11-15 DIAGNOSIS — Z0000 Encounter for general adult medical examination without abnormal findings: Secondary | ICD-10-CM | POA: Diagnosis not present

## 2018-11-15 DIAGNOSIS — R7309 Other abnormal glucose: Secondary | ICD-10-CM | POA: Diagnosis not present

## 2018-11-15 DIAGNOSIS — I10 Essential (primary) hypertension: Secondary | ICD-10-CM | POA: Diagnosis not present

## 2018-11-15 DIAGNOSIS — Z23 Encounter for immunization: Secondary | ICD-10-CM | POA: Diagnosis not present

## 2018-11-15 DIAGNOSIS — N183 Chronic kidney disease, stage 3 unspecified: Secondary | ICD-10-CM | POA: Diagnosis not present

## 2018-11-15 DIAGNOSIS — Z1389 Encounter for screening for other disorder: Secondary | ICD-10-CM | POA: Diagnosis not present

## 2019-04-11 ENCOUNTER — Other Ambulatory Visit (HOSPITAL_COMMUNITY): Payer: Self-pay | Admitting: Family Medicine

## 2019-04-11 DIAGNOSIS — Z1231 Encounter for screening mammogram for malignant neoplasm of breast: Secondary | ICD-10-CM

## 2019-04-12 ENCOUNTER — Other Ambulatory Visit: Payer: Self-pay

## 2019-04-12 ENCOUNTER — Ambulatory Visit (HOSPITAL_COMMUNITY)
Admission: RE | Admit: 2019-04-12 | Discharge: 2019-04-12 | Disposition: A | Discharge status: Home / Self Care | Payer: Medicare Other | Source: Ambulatory Visit | Attending: Family Medicine | Admitting: Family Medicine

## 2019-04-12 DIAGNOSIS — Z1231 Encounter for screening mammogram for malignant neoplasm of breast: Secondary | ICD-10-CM | POA: Diagnosis not present

## 2019-05-02 DIAGNOSIS — L820 Inflamed seborrheic keratosis: Secondary | ICD-10-CM | POA: Diagnosis not present

## 2019-05-02 DIAGNOSIS — L814 Other melanin hyperpigmentation: Secondary | ICD-10-CM | POA: Diagnosis not present

## 2019-05-02 DIAGNOSIS — L918 Other hypertrophic disorders of the skin: Secondary | ICD-10-CM | POA: Diagnosis not present

## 2019-05-02 DIAGNOSIS — L570 Actinic keratosis: Secondary | ICD-10-CM | POA: Diagnosis not present

## 2019-05-02 DIAGNOSIS — L821 Other seborrheic keratosis: Secondary | ICD-10-CM | POA: Diagnosis not present

## 2019-05-02 DIAGNOSIS — D1801 Hemangioma of skin and subcutaneous tissue: Secondary | ICD-10-CM | POA: Diagnosis not present

## 2019-10-16 DIAGNOSIS — Z23 Encounter for immunization: Secondary | ICD-10-CM | POA: Diagnosis not present

## 2019-11-30 DIAGNOSIS — L298 Other pruritus: Secondary | ICD-10-CM | POA: Diagnosis not present

## 2019-11-30 DIAGNOSIS — R7309 Other abnormal glucose: Secondary | ICD-10-CM | POA: Diagnosis not present

## 2019-11-30 DIAGNOSIS — Z0000 Encounter for general adult medical examination without abnormal findings: Secondary | ICD-10-CM | POA: Diagnosis not present

## 2019-11-30 DIAGNOSIS — K2100 Gastro-esophageal reflux disease with esophagitis, without bleeding: Secondary | ICD-10-CM | POA: Diagnosis not present

## 2019-11-30 DIAGNOSIS — I10 Essential (primary) hypertension: Secondary | ICD-10-CM | POA: Diagnosis not present

## 2019-11-30 DIAGNOSIS — N183 Chronic kidney disease, stage 3 unspecified: Secondary | ICD-10-CM | POA: Diagnosis not present

## 2019-11-30 DIAGNOSIS — Z23 Encounter for immunization: Secondary | ICD-10-CM | POA: Diagnosis not present

## 2019-11-30 DIAGNOSIS — E7800 Pure hypercholesterolemia, unspecified: Secondary | ICD-10-CM | POA: Diagnosis not present

## 2019-11-30 DIAGNOSIS — E559 Vitamin D deficiency, unspecified: Secondary | ICD-10-CM | POA: Diagnosis not present

## 2019-11-30 DIAGNOSIS — Z1389 Encounter for screening for other disorder: Secondary | ICD-10-CM | POA: Diagnosis not present

## 2019-11-30 DIAGNOSIS — M899 Disorder of bone, unspecified: Secondary | ICD-10-CM | POA: Diagnosis not present

## 2019-12-14 DIAGNOSIS — Z961 Presence of intraocular lens: Secondary | ICD-10-CM | POA: Diagnosis not present

## 2020-01-26 ENCOUNTER — Other Ambulatory Visit: Payer: Medicare Other

## 2020-01-26 DIAGNOSIS — Z20822 Contact with and (suspected) exposure to covid-19: Secondary | ICD-10-CM | POA: Diagnosis not present

## 2020-01-30 LAB — NOVEL CORONAVIRUS, NAA: SARS-CoV-2, NAA: DETECTED — AB

## 2020-05-24 ENCOUNTER — Other Ambulatory Visit (HOSPITAL_COMMUNITY): Payer: Self-pay | Admitting: Family Medicine

## 2020-05-24 DIAGNOSIS — Z1231 Encounter for screening mammogram for malignant neoplasm of breast: Secondary | ICD-10-CM

## 2020-05-24 DIAGNOSIS — Z23 Encounter for immunization: Secondary | ICD-10-CM | POA: Diagnosis not present

## 2020-05-31 ENCOUNTER — Ambulatory Visit
Admission: EM | Admit: 2020-05-31 | Discharge: 2020-05-31 | Disposition: A | Discharge status: Home / Self Care | Payer: Medicare Other | Attending: Emergency Medicine | Admitting: Emergency Medicine

## 2020-05-31 ENCOUNTER — Other Ambulatory Visit: Payer: Self-pay

## 2020-05-31 ENCOUNTER — Encounter: Payer: Self-pay | Admitting: Emergency Medicine

## 2020-05-31 ENCOUNTER — Ambulatory Visit (INDEPENDENT_AMBULATORY_CARE_PROVIDER_SITE_OTHER): Payer: Medicare Other

## 2020-05-31 DIAGNOSIS — M7989 Other specified soft tissue disorders: Secondary | ICD-10-CM | POA: Diagnosis not present

## 2020-05-31 DIAGNOSIS — W208XXA Other cause of strike by thrown, projected or falling object, initial encounter: Secondary | ICD-10-CM | POA: Diagnosis not present

## 2020-05-31 DIAGNOSIS — S99921A Unspecified injury of right foot, initial encounter: Secondary | ICD-10-CM | POA: Diagnosis not present

## 2020-05-31 DIAGNOSIS — M79671 Pain in right foot: Secondary | ICD-10-CM

## 2020-05-31 DIAGNOSIS — M19071 Primary osteoarthritis, right ankle and foot: Secondary | ICD-10-CM | POA: Diagnosis not present

## 2020-05-31 NOTE — ED Provider Notes (Signed)
[facility]   [id number] 05/31/20 Arrival Time: 5093  CC: RT foot pain  SUBJECTIVE: History from: patient. Jaime Lin is a 80 y.o. female complains of RT foot pain and injury that occurred last night.  Dropped a bottle of downy.  Localizes the pain to the top of foot.  Describes the pain as intermittent and achy in character.  Has tried OTC medications without relief.  Symptoms are made worse with weight bearing.  Denies similar symptoms in the past.  Complains of bruising.  Denies fever, chills, erythema, effusion, weakness, numbness and tingling.      ROS: As per HPI.  All other pertinent ROS negative.     Past Medical History:  Diagnosis Date  . Acid reflux   . PUD (peptic ulcer disease)    Past Surgical History:  Procedure Laterality Date  . ABDOMINAL HYSTERECTOMY    . APPENDECTOMY    . CHOLECYSTECTOMY    . COLONOSCOPY    . COLONOSCOPY N/A 09/05/2015   Procedure: COLONOSCOPY;  Surgeon: Rogene Houston, MD;  Location: [facility];  Service: Endoscopy;  Laterality: N/A;  925   Allergies  Allergen Reactions  . Voltaren [Diclofenac Sodium]     Upset wstomach   No current facility-administered medications on file prior to encounter.   Current Outpatient Medications on File Prior to Encounter  Medication Sig Dispense Refill  . aspirin EC 81 MG tablet Take 162 mg by mouth once as needed.    . Calcium Carbonate-Vitamin D3 (CALCIUM 600-D) 600-400 MG-UNIT TABS Take 1 tablet by mouth daily.    . Cholecalciferol (VITAMIN D) 2000 UNITS CAPS Take 1 capsule by mouth daily.    Marland Kitchen lisinopril (PRINIVIL,ZESTRIL) 20 MG tablet Take 1 tablet (20 mg total) by mouth daily. 30 tablet 0  . Multiple Vitamin (MULTIVITAMIN WITH MINERALS) TABS tablet Take 1 tablet by mouth daily.    Marland Kitchen omeprazole (PRILOSEC) 20 MG capsule Take 20 mg by mouth once as needed (indigestion).     . pantoprazole (PROTONIX) 40 MG tablet Take 40 mg by mouth once as needed.     Social History   Socioeconomic  History  . Marital status: Married    Spouse name: Not on file  . Number of children: Not on file  . Years of education: Not on file  . Highest education level: Not on file  Occupational History  . Not on file  Tobacco Use  . Smoking status: Never Smoker  . Smokeless tobacco: Never Used  Substance and Sexual Activity  . Alcohol use: No  . Drug use: No  . Sexual activity: Not on file  Other Topics Concern  . Not on file  Social History Narrative  . Not on file   Social Determinants of Health   Financial Resource Strain: Not on file  Food Insecurity: Not on file  Transportation Needs: Not on file  Physical Activity: Not on file  Stress: Not on file  Social Connections: Not on file  Intimate Partner Violence: Not on file   Family History  Problem Relation Age of Onset  . Cirrhosis Mother   . Hypertension Father   . Stomach cancer Father   . Coronary artery disease Sister   . Prostate cancer Brother   . Sleep apnea Brother   . Breast cancer Sister     OBJECTIVE:  Vitals:   05/31/20 1400  BP: (!) 102/52  Pulse: 75  Resp: 18  Temp: 98.4 F (36.9 C)  TempSrc: Oral  SpO2: 96%    General appearance: ALERT; in no acute distress.  Head: NCAT Lungs: Normal respiratory effort CV: Dorsalis pedis pulse 2+ Musculoskeletal: RT foot Inspection: ecchymosis dorsal aspect of foot Palpation: TTP over distal 2nd MT or proximal 2nd digit ROM: FROM active and passive Strength: deferred Skin: warm and dry Neurologic: Ambulates without difficulty Psychological: alert and cooperative; normal mood and affect  DIAGNOSTIC STUDIES:  DG Foot Complete Right  Result Date: 05/31/2020 CLINICAL DATA:  Pain, dropped a bottle on foot EXAM: RIGHT FOOT COMPLETE - 3+ VIEW COMPARISON:  None. FINDINGS: There is mild dorsal forefoot swelling. There is no evidence of acute fracture. There is and mild naviculocuneiform degenerative change. Mild first MTP and scattered interphalangeal joint  degenerative change. IMPRESSION: No evidence of acute fracture. Mild dorsal forefoot soft tissue swelling. Mild midfoot and forefoot degenerative change. Electronically Signed   By: Maurine Simmering   On: 05/31/2020 14:35    X-rays negative for bony abnormalities including fracture, or dislocation.  No soft tissue swelling.    I have reviewed the x-rays myself and the radiologist interpretation. I am in agreement with the radiologist interpretation.     ASSESSMENT & PLAN:  1. Injury of right foot, initial encounter   2. Right foot pain    X-rays negative for fracture or dislocation Continue conservative management of rest, ice, and elevation Alternate ibuprofen and/or tylenol as needed for pain Follow up with PCP if symptoms persist Return or go to the ER if you have any new or worsening symptoms (fever, chills, chest pain, redness, swelling, deformity, etc...)   Reviewed expectations re: course of current medical issues. Questions answered. Outlined signs and symptoms indicating need for more acute intervention. Patient verbalized understanding. After Visit Summary given.    Lestine Box, PA-C 05/31/20 1442

## 2020-05-31 NOTE — Discharge Instructions (Signed)
X-rays negative for fracture or dislocation Continue conservative management of rest, ice, and elevation Alternate ibuprofen and/or tylenol as needed for pain Follow up with PCP if symptoms persist Return or go to the ER if you have any new or worsening symptoms (fever, chills, chest pain, redness, swelling, deformity, etc...)

## 2020-05-31 NOTE — ED Triage Notes (Signed)
Dropped a bottle of downy on right foot last night.  bruising noted to top of foot.

## 2020-06-03 ENCOUNTER — Ambulatory Visit (HOSPITAL_COMMUNITY)
Admission: RE | Admit: 2020-06-03 | Discharge: 2020-06-03 | Disposition: A | Discharge status: Home / Self Care | Payer: Medicare Other | Source: Ambulatory Visit | Attending: Family Medicine | Admitting: Family Medicine

## 2020-06-03 DIAGNOSIS — Z1231 Encounter for screening mammogram for malignant neoplasm of breast: Secondary | ICD-10-CM | POA: Diagnosis not present

## 2020-07-12 DIAGNOSIS — Z20822 Contact with and (suspected) exposure to covid-19: Secondary | ICD-10-CM | POA: Diagnosis not present

## 2020-08-15 ENCOUNTER — Ambulatory Visit
Admission: EM | Admit: 2020-08-15 | Discharge: 2020-08-15 | Disposition: A | Discharge status: Home / Self Care | Payer: Medicare Other | Attending: Emergency Medicine | Admitting: Emergency Medicine

## 2020-08-15 ENCOUNTER — Other Ambulatory Visit: Payer: Self-pay

## 2020-08-15 ENCOUNTER — Encounter: Payer: Self-pay | Admitting: Emergency Medicine

## 2020-08-15 DIAGNOSIS — R059 Cough, unspecified: Secondary | ICD-10-CM

## 2020-08-15 NOTE — ED Triage Notes (Signed)
Hoarse, sore throat, coughing up phlegm for past couple of days.  [place] around [place] the beach and tested covid positive. Pt took a home test yesterday that was negative.

## 2020-08-15 NOTE — ED Provider Notes (Signed)
[facility]   [id number] 08/15/20 Arrival Time: [id number]   CC: COVID symptoms  SUBJECTIVE: History from: patient.  Jaime Lin is a 80 y.o. female who presents with hoarse voice, sore throat, and cough x 1-2 days.  Admits to covid exposure to great granddaughter who tested positive.  Denies alleviating or aggravating factors.  Report previous covid infection in the past.   Denies fever, SOB, wheezing, chest pain, nausea, changes in bowel or bladder habits.     ROS: As per HPI.  All other pertinent ROS negative.     Past Medical History:  Diagnosis Date   Acid reflux    PUD (peptic ulcer disease)    Past Surgical History:  Procedure Laterality Date   ABDOMINAL HYSTERECTOMY     APPENDECTOMY     CHOLECYSTECTOMY     COLONOSCOPY     COLONOSCOPY N/A 09/05/2015   Procedure: COLONOSCOPY;  Surgeon: Rogene Houston, MD;  Location: [facility];  Service: Endoscopy;  Laterality: N/A;  925   Allergies  Allergen Reactions   Voltaren [Diclofenac Sodium]     Upset wstomach   No current facility-administered medications on file prior to encounter.   Current Outpatient Medications on File Prior to Encounter  Medication Sig Dispense Refill   aspirin EC 81 MG tablet Take 162 mg by mouth once as needed.     Calcium Carbonate-Vitamin D3 (CALCIUM 600-D) 600-400 MG-UNIT TABS Take 1 tablet by mouth daily.     Cholecalciferol (VITAMIN D) 2000 UNITS CAPS Take 1 capsule by mouth daily.     lisinopril (PRINIVIL,ZESTRIL) 20 MG tablet Take 1 tablet (20 mg total) by mouth daily. 30 tablet 0   Multiple Vitamin (MULTIVITAMIN WITH MINERALS) TABS tablet Take 1 tablet by mouth daily.     omeprazole (PRILOSEC) 20 MG capsule Take 20 mg by mouth once as needed (indigestion).      pantoprazole (PROTONIX) 40 MG tablet Take 40 mg by mouth once as needed.     Social History   Socioeconomic History   Marital status: Married    Spouse name: Not on file   Number of children: Not on file   Years of  education: Not on file   Highest education level: Not on file  Occupational History   Not on file  Tobacco Use   Smoking status: Never   Smokeless tobacco: Never  Substance and Sexual Activity   Alcohol use: No   Drug use: No   Sexual activity: Not on file  Other Topics Concern   Not on file  Social History Narrative   Not on file   Social Determinants of Health   Financial Resource Strain: Not on file  Food Insecurity: Not on file  Transportation Needs: Not on file  Physical Activity: Not on file  Stress: Not on file  Social Connections: Not on file  Intimate Partner Violence: Not on file   Family History  Problem Relation Age of Onset   Cirrhosis Mother    Hypertension Father    Stomach cancer Father    Coronary artery disease Sister    Prostate cancer Brother    Sleep apnea Brother    Breast cancer Sister     OBJECTIVE:  Vitals:   08/15/20 1559  BP: 124/74  Pulse: 70  Resp: 17  Temp: 98.4 F (36.9 C)  TempSrc: Tympanic  SpO2: 98%    General appearance: alert; well-appearing, nontoxic; speaking in full sentences and tolerating own secretions HEENT: NCAT; Ears: EACs  clear, TMs pearly gray; Eyes: PERRL.  EOM grossly intact.Nose: nares patent without rhinorrhea, Throat: oropharynx clear, tonsils non erythematous or enlarged, uvula midline; voice hoarse Neck: supple without LAD Lungs: unlabored respirations, symmetrical air entry; cough: absent; no respiratory distress; CTAB Heart: regular rate and rhythm.  Skin: warm and dry Psychological: alert and cooperative; normal mood and affect   ASSESSMENT & PLAN:  1. Cough     COVID testing ordered.  It will take between 5-7 days for test results.  Someone will contact you regarding abnormal results.    In the meantime: You should remain isolated in your home for 5 days from symptom onset AND greater than 72 hours after symptoms resolution (absence of fever without the use of fever-reducing medication and  improvement in respiratory symptoms), whichever is longer Get plenty of rest and push fluids Use OTC zyrtec for nasal congestion, runny nose, and/or sore throat Use OTC flonase for nasal congestion and runny nose Use medications daily for symptom relief Use OTC medications like ibuprofen or tylenol as needed fever or pain Call or go to the ED if you have any new or worsening symptoms such as fever, worsening cough, shortness of breath, chest tightness, chest pain, turning blue, changes in mental status, etc...   Reviewed expectations re: course of current medical issues. Questions answered. Outlined signs and symptoms indicating need for more acute intervention. Patient verbalized understanding.          Lestine Box, PA-C 08/15/20 1647

## 2020-08-17 LAB — NOVEL CORONAVIRUS, NAA: SARS-CoV-2, NAA: NOT DETECTED

## 2020-08-17 LAB — SARS-COV-2, NAA 2 DAY TAT

## 2020-11-04 DIAGNOSIS — Z23 Encounter for immunization: Secondary | ICD-10-CM | POA: Diagnosis not present

## 2020-12-03 DIAGNOSIS — I10 Essential (primary) hypertension: Secondary | ICD-10-CM | POA: Diagnosis not present

## 2020-12-03 DIAGNOSIS — Z1389 Encounter for screening for other disorder: Secondary | ICD-10-CM | POA: Diagnosis not present

## 2020-12-03 DIAGNOSIS — M899 Disorder of bone, unspecified: Secondary | ICD-10-CM | POA: Diagnosis not present

## 2020-12-03 DIAGNOSIS — D692 Other nonthrombocytopenic purpura: Secondary | ICD-10-CM | POA: Diagnosis not present

## 2020-12-03 DIAGNOSIS — E7800 Pure hypercholesterolemia, unspecified: Secondary | ICD-10-CM | POA: Diagnosis not present

## 2020-12-03 DIAGNOSIS — K2100 Gastro-esophageal reflux disease with esophagitis, without bleeding: Secondary | ICD-10-CM | POA: Diagnosis not present

## 2020-12-03 DIAGNOSIS — E559 Vitamin D deficiency, unspecified: Secondary | ICD-10-CM | POA: Diagnosis not present

## 2020-12-03 DIAGNOSIS — R7309 Other abnormal glucose: Secondary | ICD-10-CM | POA: Diagnosis not present

## 2020-12-03 DIAGNOSIS — N183 Chronic kidney disease, stage 3 unspecified: Secondary | ICD-10-CM | POA: Diagnosis not present

## 2020-12-03 DIAGNOSIS — Z0000 Encounter for general adult medical examination without abnormal findings: Secondary | ICD-10-CM | POA: Diagnosis not present

## 2020-12-12 DIAGNOSIS — Z961 Presence of intraocular lens: Secondary | ICD-10-CM | POA: Diagnosis not present

## 2021-01-30 DIAGNOSIS — R42 Dizziness and giddiness: Secondary | ICD-10-CM | POA: Diagnosis not present

## 2021-02-17 DIAGNOSIS — Z20822 Contact with and (suspected) exposure to covid-19: Secondary | ICD-10-CM | POA: Diagnosis not present

## 2021-04-11 DIAGNOSIS — Z20822 Contact with and (suspected) exposure to covid-19: Secondary | ICD-10-CM | POA: Diagnosis not present

## 2021-04-24 DIAGNOSIS — Z20822 Contact with and (suspected) exposure to covid-19: Secondary | ICD-10-CM | POA: Diagnosis not present

## 2021-06-08 ENCOUNTER — Ambulatory Visit
Admission: EM | Admit: 2021-06-08 | Discharge: 2021-06-08 | Disposition: A | Discharge status: Home / Self Care | Payer: Medicare Other | Attending: Nurse Practitioner | Admitting: Nurse Practitioner

## 2021-06-08 ENCOUNTER — Other Ambulatory Visit: Payer: Self-pay

## 2021-06-08 DIAGNOSIS — R42 Dizziness and giddiness: Secondary | ICD-10-CM | POA: Diagnosis not present

## 2021-06-08 MED ORDER — MECLIZINE HCL 12.5 MG PO TABS: 12.5000 mg | ORAL_TABLET | Freq: Three times a day (TID) | ORAL | 0 refills | Status: DC | PRN

## 2021-06-08 NOTE — ED Triage Notes (Signed)
Pt reports feeling lightheadedness and nausea since this morning. Lightheadedness improved when sitting donw. Denies chest pain, headache, vision changes.

## 2021-06-08 NOTE — Discharge Instructions (Addendum)
There is a drop in your blood pressure when you are lying versus when you are standing.  This could be related to the symptoms you are currently experiencing. I am going to prescribe a medication to see if this helps with your dizziness.  This medication will make you drowsy so no driving, operate any heavy equipment or strenuous activity when taking the medication. Continue to increase your fluids. I would like for you to follow-up with your primary care physician this week for further evaluation.  Go to the ER if you develop slurred speech, change in her mentation, weakness, inability to stand or walk, or other concerns.

## 2021-06-08 NOTE — ED Provider Notes (Signed)
[facility]    CSN: [id number] Arrival date & time: 06/08/21  1028      History   Chief Complaint Chief Complaint  Patient presents with   Dizziness   Nausea    HPI Jaime Lin is a 81 y.o. female.   Patient presents with dizziness .  The dizziness has been present for 1 day. The patient describes the symptoms as lightheadedness. Symptoms are exacerbated by rising from squatting or sitting position The patient also complains of nausea. Patient denies headache, blurred vision, change in vision, aural pressure, otalgia, otorrhea, tinnitus, hearing loss, chest pain, shortness of breath, or difficulty breathing.  Patient states she was seen by her primary care physician several months ago, but she was told and that she needed to increase her fluids and provide other supportive care which helped her symptoms.  Patient states she has not had any episodes since that time.  The history is provided by the patient.   Past Medical History:  Diagnosis Date   Acid reflux    PUD (peptic ulcer disease)     There are no problems to display for this patient.   Past Surgical History:  Procedure Laterality Date   ABDOMINAL HYSTERECTOMY     APPENDECTOMY     CHOLECYSTECTOMY     COLONOSCOPY     COLONOSCOPY N/A 09/05/2015   Procedure: COLONOSCOPY;  Surgeon: Rogene Houston, MD;  Location: [facility];  Service: Endoscopy;  Laterality: N/A;  925    OB History   No obstetric history on file.      Home Medications    Prior to Admission medications   Medication Sig Start Date End Date Taking? Authorizing Provider  meclizine (ANTIVERT) 12.5 MG tablet Take 1 tablet (12.5 mg total) by mouth 3 (three) times daily as needed for dizziness. 06/08/21  Yes Jaime Lin, Jaime Lea, NP  aspirin EC 81 MG tablet Take 162 mg by mouth once as needed.    [provider]  Calcium Carbonate-Vitamin D3 (CALCIUM 600-D) 600-400 MG-UNIT TABS Take 1 tablet by mouth daily.    [provider]  Cholecalciferol (VITAMIN D) 2000 UNITS CAPS Take 1 capsule by mouth daily.    [provider]  lisinopril (PRINIVIL,ZESTRIL) 20 MG tablet Take 1 tablet (20 mg total) by mouth daily. 02/25/17   Milton Ferguson, MD  Multiple Vitamin (MULTIVITAMIN WITH MINERALS) TABS tablet Take 1 tablet by mouth daily.    [provider]  omeprazole (PRILOSEC) 20 MG capsule Take 20 mg by mouth once as needed (indigestion).     [provider]  pantoprazole (PROTONIX) 40 MG tablet Take 40 mg by mouth once as needed.    [provider]    Family History Family History  Problem Relation Age of Onset   Cirrhosis Mother    Hypertension Father    Stomach cancer Father    Coronary artery disease Sister    Prostate cancer Brother    Sleep apnea Brother    Breast cancer Sister     Social History Social History   Tobacco Use   Smoking status: Never   Smokeless tobacco: Never  Substance Use Topics   Alcohol use: No   Drug use: No     Allergies   Voltaren [diclofenac sodium]   Review of Systems Review of Systems PER HPI  Physical Exam Triage Vital Signs ED Triage Vitals  Enc Vitals Group     BP 06/08/21 1051 (!) 145/76  Pulse Rate 06/08/21 1051 61     Resp 06/08/21 1051 16     Temp 06/08/21 1051 97.8 F (36.6 C)     Temp Source 06/08/21 1051 Oral     SpO2 06/08/21 1051 95 %     Weight --      Height --      Head Circumference --      Peak Flow --      Pain Score 06/08/21 1053 0     Pain Loc --      Pain Edu? --      Excl. in [place]? --    Orthostatic VS for the past 24 hrs:  BP- Lying Pulse- Lying BP- Sitting Pulse- Sitting BP- Standing at 0 minutes Pulse- Standing at 0 minutes  06/08/21 1140 173/75 64 181/76 64 147/84 60    Updated Vital Signs BP (!) 145/76 (BP Location: Right Arm)   Pulse 61   Temp 97.8 F (36.6 C) (Oral)   Resp 16   SpO2 95%   Visual Acuity Right Eye Distance:   Left Eye Distance:   Bilateral Distance:     Right Eye Near:   Left Eye Near:    Bilateral Near:     Physical Exam Vitals and nursing note reviewed.  Constitutional:      Appearance: Normal appearance.  HENT:     Head: Normocephalic.     Right Ear: Tympanic membrane, ear canal and external ear normal.     Left Ear: Tympanic membrane, ear canal and external ear normal.     Nose: Nose normal.     Mouth/Throat:     Mouth: Mucous membranes are moist.  Eyes:     Extraocular Movements: Extraocular movements intact.     Conjunctiva/sclera: Conjunctivae normal.     Pupils: Pupils are equal, round, and reactive to light.  Cardiovascular:     Rate and Rhythm: Normal rate and regular rhythm.     Pulses: Normal pulses.     Heart sounds: Normal heart sounds.  Pulmonary:     Effort: Pulmonary effort is normal.     Breath sounds: Normal breath sounds.  Abdominal:     General: Bowel sounds are normal.     Palpations: Abdomen is soft.  Musculoskeletal:     Cervical back: Normal range of motion.  Skin:    General: Skin is warm and dry.     Capillary Refill: Capillary refill takes less than 2 seconds.  Neurological:     General: No focal deficit present.     Mental Status: She is alert and oriented to person, place, and time.     GCS: GCS eye subscore is 4. GCS verbal subscore is 5. GCS motor subscore is 6.     Cranial Nerves: Cranial nerves 2-12 are intact.     Motor: Motor function is intact.     Coordination: Coordination is intact.  Psychiatric:        Mood and Affect: Mood normal.        Behavior: Behavior normal.     UC Treatments / Results  Labs (all labs ordered are listed, but only abnormal results are displayed) Labs Reviewed - No data to display  EKG: Sinus Bradycardia, no STEMI   Radiology No results found.  Procedures Procedures (including critical care time)  Medications Ordered in UC Medications - No data to display  Initial Impression / Assessment and Plan / UC Course  I have reviewed the triage  vital signs and  the nursing notes.  Pertinent labs & imaging results that were available during my care of the patient were reviewed by me and considered in my medical decision making (see chart for details).  Patient presents with complaints of dizziness that started this morning.  Her neurological exam is within normal limits, there are no deficits noted.  Her EKG did show sinus bradycardia, no indications of STEMI.  Orthostatics revealed a more than 63ZCHY of systolic BP and 85OYDX of diastolic BP between lying and standing.  This change is relative to her symptoms.  Patient's exam is otherwise reassuring.  We will give her a low-dose of meclizine to see if this helps with her dizziness; but I would like for her to follow-up with her primary care physician this week.  Would like for her to let him know what was found on her exam today.  Patient was advised to increase fluids, and allow for plenty of rest.  Patient was advised to go to the ER if she develops slurred speech, change in her mentation, weakness, inability to stand or walk, or other concerns.  Final Clinical Impressions(s) / UC Diagnoses   Final diagnoses:  Orthostatic dizziness     Discharge Instructions      There is a drop in your blood pressure when you are lying versus when you are standing.  This could be related to the symptoms you are currently experiencing. I am going to prescribe a medication to see if this helps with your dizziness.  This medication will make you drowsy so no driving, operate any heavy equipment or strenuous activity when taking the medication. Continue to increase your fluids. I would like for you to follow-up with your primary care physician this week for further evaluation.  Go to the ER if you develop slurred speech, change in her mentation, weakness, inability to stand or walk, or other concerns.     ED Prescriptions     Medication Sig Dispense Auth. Provider   meclizine (ANTIVERT) 12.5 MG  tablet Take 1 tablet (12.5 mg total) by mouth 3 (three) times daily as needed for dizziness. 30 tablet Brock Larmon-Warren, Jaime Lea, NP      PDMP not reviewed this encounter.   Tish Men, NP 06/08/21 1204

## 2021-06-11 DIAGNOSIS — Z23 Encounter for immunization: Secondary | ICD-10-CM | POA: Diagnosis not present

## 2021-06-11 DIAGNOSIS — E785 Hyperlipidemia, unspecified: Secondary | ICD-10-CM | POA: Diagnosis not present

## 2021-06-11 DIAGNOSIS — I10 Essential (primary) hypertension: Secondary | ICD-10-CM | POA: Diagnosis not present

## 2021-06-11 DIAGNOSIS — I951 Orthostatic hypotension: Secondary | ICD-10-CM | POA: Diagnosis not present

## 2021-10-07 ENCOUNTER — Ambulatory Visit (INDEPENDENT_AMBULATORY_CARE_PROVIDER_SITE_OTHER)
Admission: EM | Admit: 2021-10-07 | Discharge: 2021-10-07 | Disposition: A | Discharge status: Home / Self Care | Payer: Medicare Other | Source: Home / Self Care

## 2021-10-07 ENCOUNTER — Other Ambulatory Visit: Payer: Self-pay

## 2021-10-07 ENCOUNTER — Ambulatory Visit (INDEPENDENT_AMBULATORY_CARE_PROVIDER_SITE_OTHER): Payer: Medicare Other

## 2021-10-07 ENCOUNTER — Encounter: Payer: Self-pay | Admitting: Emergency Medicine

## 2021-10-07 DIAGNOSIS — R531 Weakness: Secondary | ICD-10-CM | POA: Diagnosis not present

## 2021-10-07 DIAGNOSIS — Z1152 Encounter for screening for COVID-19: Secondary | ICD-10-CM | POA: Insufficient documentation

## 2021-10-07 DIAGNOSIS — Z803 Family history of malignant neoplasm of breast: Secondary | ICD-10-CM | POA: Diagnosis not present

## 2021-10-07 DIAGNOSIS — Z8249 Family history of ischemic heart disease and other diseases of the circulatory system: Secondary | ICD-10-CM | POA: Diagnosis not present

## 2021-10-07 DIAGNOSIS — R509 Fever, unspecified: Secondary | ICD-10-CM | POA: Diagnosis not present

## 2021-10-07 DIAGNOSIS — E871 Hypo-osmolality and hyponatremia: Secondary | ICD-10-CM | POA: Diagnosis not present

## 2021-10-07 DIAGNOSIS — Z20822 Contact with and (suspected) exposure to covid-19: Secondary | ICD-10-CM | POA: Diagnosis present

## 2021-10-07 DIAGNOSIS — Z800 Family history of malignant neoplasm of digestive organs: Secondary | ICD-10-CM | POA: Diagnosis not present

## 2021-10-07 DIAGNOSIS — R5383 Other fatigue: Secondary | ICD-10-CM | POA: Insufficient documentation

## 2021-10-07 DIAGNOSIS — Z7982 Long term (current) use of aspirin: Secondary | ICD-10-CM | POA: Diagnosis not present

## 2021-10-07 DIAGNOSIS — I10 Essential (primary) hypertension: Secondary | ICD-10-CM | POA: Diagnosis not present

## 2021-10-07 DIAGNOSIS — K279 Peptic ulcer, site unspecified, unspecified as acute or chronic, without hemorrhage or perforation: Secondary | ICD-10-CM | POA: Diagnosis not present

## 2021-10-07 DIAGNOSIS — Z8042 Family history of malignant neoplasm of prostate: Secondary | ICD-10-CM | POA: Diagnosis not present

## 2021-10-07 DIAGNOSIS — Z886 Allergy status to analgesic agent status: Secondary | ICD-10-CM | POA: Diagnosis not present

## 2021-10-07 DIAGNOSIS — Z79899 Other long term (current) drug therapy: Secondary | ICD-10-CM | POA: Diagnosis not present

## 2021-10-07 LAB — POCT URINALYSIS DIP (MANUAL ENTRY)
Bilirubin, UA: NEGATIVE
Glucose, UA: NEGATIVE mg/dL
Nitrite, UA: NEGATIVE
Protein Ur, POC: 30 mg/dL — AB
Spec Grav, UA: 1.015 (ref 1.010–1.025)
Urobilinogen, UA: 1 E.U./dL
pH, UA: 6 (ref 5.0–8.0)

## 2021-10-07 LAB — RESP PANEL BY RT-PCR (FLU A&B, COVID) ARPGX2
Influenza A by PCR: NEGATIVE
Influenza B by PCR: NEGATIVE
SARS Coronavirus 2 by RT PCR: NEGATIVE

## 2021-10-07 NOTE — Discharge Instructions (Addendum)
Your x-ray does not show any pneumonia.  Your EKG does not show any acute cardiac issues. Your urinalysis shows that you may have a urinary tract infection; however, I am waiting on a urine culture to confirm.  If the culture results are positive, you will be contacted and started on an antibiotic.  Your other lab work is also pending.  You will be contacted if your pending test results are abnormal. As discussed, you are a candidate to receive Paxlovid if your COVID test is positive and your pending lab results show your kidney function is within normal limits. Increase fluids and allow for plenty of rest. Continue Tylenol as needed for pain, fever, or general discomfort. If your lab results are normal and you continue to experience symptoms, please follow-up with your primary care physician for further evaluation. Go to the emergency department if you experience worsening weakness, inability to ambulate or walk, chest pain, shortness of breath, difficulty breathing, or other concerns.

## 2021-10-07 NOTE — ED Triage Notes (Signed)
Frequent stools on Sunday.  Felt fatigued yesterday.  Fever last night.

## 2021-10-07 NOTE — ED Provider Notes (Signed)
[facility]    CSN: [id number] Arrival date & time: 10/07/21  0818      History   Chief Complaint No chief complaint on file.   HPI Jaime Lin is a 81 y.o. female.   The history is provided by the patient.   Patient presents with a 2-day history of fatigue.  Patient states she was experiencing frequent stools 2 days ago when her symptoms started, which is since improved.  She also states last evening, she had a fever, stating she "felt hot on the inside".  She states that she did check her temperature which was around 100.  She denies headache, dizziness, chest pain, nausea, vomiting, cough, ear pain, sore throat, abdominal pain, nausea, vomiting, or diarrhea.  Patient states that she has been trying to increase her fluid intake.  She states that she has been sleeping more since her symptoms started and was concerned because she normally does not do that.  Patient states that she does not take any other medications besides pantoprazole and hydrochlorothiazide.  She states "something is not right".  Past Medical History:  Diagnosis Date   Acid reflux    PUD (peptic ulcer disease)     There are no problems to display for this patient.   Past Surgical History:  Procedure Laterality Date   ABDOMINAL HYSTERECTOMY     APPENDECTOMY     CHOLECYSTECTOMY     COLONOSCOPY     COLONOSCOPY N/A 09/05/2015   Procedure: COLONOSCOPY;  Surgeon: Rogene Houston, MD;  Location: [facility];  Service: Endoscopy;  Laterality: N/A;  925    OB History   No obstetric history on file.      Home Medications    Prior to Admission medications   Medication Sig Start Date End Date Taking? Authorizing Provider  aspirin EC 81 MG tablet Take 162 mg by mouth once as needed.    [provider]  Calcium Carbonate-Vitamin D3 (CALCIUM 600-D) 600-400 MG-UNIT TABS Take 1 tablet by mouth daily.    [provider]  Cholecalciferol (VITAMIN D) 2000 UNITS CAPS Take 1 capsule by  mouth daily.    [provider]  lisinopril (PRINIVIL,ZESTRIL) 20 MG tablet Take 1 tablet (20 mg total) by mouth daily. 02/25/17   Milton Ferguson, MD  meclizine (ANTIVERT) 12.5 MG tablet Take 1 tablet (12.5 mg total) by mouth 3 (three) times daily as needed for dizziness. 06/08/21   Rozalynn Buege-Warren, Alda Lea, NP  Multiple Vitamin (MULTIVITAMIN WITH MINERALS) TABS tablet Take 1 tablet by mouth daily.    [provider]  omeprazole (PRILOSEC) 20 MG capsule Take 20 mg by mouth once as needed (indigestion).     [provider]  pantoprazole (PROTONIX) 40 MG tablet Take 40 mg by mouth once as needed.    [provider]    Family History Family History  Problem Relation Age of Onset   Cirrhosis Mother    Hypertension Father    Stomach cancer Father    Coronary artery disease Sister    Prostate cancer Brother    Sleep apnea Brother    Breast cancer Sister     Social History Social History   Tobacco Use   Smoking status: Never   Smokeless tobacco: Never  Substance Use Topics   Alcohol use: No   Drug use: No     Allergies   Voltaren [diclofenac sodium]   Review of Systems Review of Systems Per HPI  Physical Exam Triage Vital  Signs ED Triage Vitals  Enc Vitals Group     BP 10/07/21 0841 102/67     Pulse Rate 10/07/21 0840 71     Resp 10/07/21 0840 16     Temp 10/07/21 0840 97.8 F (36.6 C)     Temp Source 10/07/21 0840 Oral     SpO2 10/07/21 0840 97 %     Weight --      Height --      Head Circumference --      Peak Flow --      Pain Score 10/07/21 0843 0     Pain Loc --      Pain Edu? --      Excl. in [place]? --    No data found.  Updated Vital Signs BP 102/67 (BP Location: Right Arm)   Pulse 71   Temp 97.8 F (36.6 C) (Oral)   Resp 16   SpO2 97%   Visual Acuity Right Eye Distance:   Left Eye Distance:   Bilateral Distance:    Right Eye Near:   Left Eye Near:    Bilateral Near:     Physical Exam Vitals and nursing  note reviewed.  Constitutional:      General: She is not in acute distress.    Appearance: Normal appearance. She is well-developed.  HENT:     Head: Normocephalic.     Right Ear: Tympanic membrane, ear canal and external ear normal.     Left Ear: Tympanic membrane, ear canal and external ear normal.     Nose: Nose normal.     Mouth/Throat:     Mouth: Mucous membranes are moist.  Eyes:     Extraocular Movements: Extraocular movements intact.     Conjunctiva/sclera: Conjunctivae normal.     Pupils: Pupils are equal, round, and reactive to light.  Cardiovascular:     Rate and Rhythm: Normal rate and regular rhythm.     Heart sounds: Normal heart sounds.  Pulmonary:     Effort: Pulmonary effort is normal.     Breath sounds: Normal breath sounds.  Abdominal:     General: Bowel sounds are normal. There is no distension.     Palpations: Abdomen is soft.     Tenderness: There is no abdominal tenderness. There is no guarding or rebound.  Genitourinary:    Vagina: Normal. No vaginal discharge.  Musculoskeletal:     Cervical back: Normal range of motion.     Right lower leg: No edema.     Left lower leg: No edema.  Lymphadenopathy:     Cervical: No cervical adenopathy.  Skin:    General: Skin is warm and dry.     Findings: No erythema or rash.  Neurological:     General: No focal deficit present.     Mental Status: She is alert and oriented to person, place, and time.     GCS: GCS eye subscore is 4. GCS verbal subscore is 5. GCS motor subscore is 6.     Cranial Nerves: Cranial nerves 2-12 are intact. No cranial nerve deficit or facial asymmetry.     Sensory: Sensation is intact.     Motor: Motor function is intact.     Coordination: Coordination is intact.  Psychiatric:        Mood and Affect: Mood normal.        Behavior: Behavior normal.      UC Treatments / Results  Labs (all labs ordered are listed, but only  abnormal results are displayed) Labs Reviewed  POCT  URINALYSIS DIP (MANUAL ENTRY) - Abnormal; Notable for the following components:      Result Value   Ketones, POC UA trace (5) (*)    Blood, UA small (*)    Protein Ur, POC =30 (*)    Leukocytes, UA Small (1+) (*)    All other components within normal limits  RESP PANEL BY RT-PCR (FLU A&B, COVID) [id number]  URINE CULTURE  CBC WITH DIFFERENTIAL/PLATELET  COMPREHENSIVE METABOLIC PANEL    EKG:    Radiology DG Chest 2 View  Result Date: 10/07/2021 CLINICAL DATA:  Fever. EXAM: CHEST - 2 VIEW COMPARISON:  March 05, 2013. FINDINGS: The heart size and mediastinal contours are within normal limits. Both lungs are clear. The visualized skeletal structures are unremarkable. IMPRESSION: No active cardiopulmonary disease. Electronically Signed   By: Marijo Conception M.D.   On: 10/07/2021 09:47    Procedures Procedures (including critical care time)  Medications Ordered in UC Medications - No data to display  Initial Impression / Assessment and Plan / UC Course  I have reviewed the triage vital signs and the nursing notes.  Pertinent labs & imaging results that were available during my care of the patient were reviewed by me and considered in my medical decision making (see chart for details).  Patient presents for complaints of weakness and fever that been present for the past several days.  On exam, patient's vital signs are stable, she is in no acute distress.  EKG was performed which did not show any signs of acute STEMI, urinalysis was positive for leukocytes, chest x-ray was negative for any active cardiopulmonary disease.  COVID/flu test and urine culture are pending along with a CBC and CMP.  Difficult to ascertain the cause of the patient's symptoms without the pending test results.  Would like to see if the urine culture indicates that patient has a UTI.  Discussed with patient that if her pending lab results are normal and she continues to experience symptoms, she will need to follow-up  with her primary care physician for further evaluation.  Patient was given supportive care recommendations along with strict indications of when to go to the emergency department.  Patient verbalizes understanding, all questions were answered. Final Clinical Impressions(s) / UC Diagnoses   Final diagnoses:  Fever, unspecified  Encounter for screening for COVID-19  Fatigue, unspecified type     Discharge Instructions      Your x-ray does not show any pneumonia.  Your EKG does not show any acute cardiac issues. Your urinalysis shows that you may have a urinary tract infection; however, I am waiting on a urine culture to confirm.  If the culture results are positive, you will be contacted and started on an antibiotic.  Your other lab work is also pending.  You will be contacted if your pending test results are abnormal. As discussed, you are a candidate to receive Paxlovid if your COVID test is positive and your pending lab results show your kidney function is within normal limits. Increase fluids and allow for plenty of rest. Continue Tylenol as needed for pain, fever, or general discomfort. If your lab results are normal and you continue to experience symptoms, please follow-up with your primary care physician for further evaluation. Go to the emergency department if you experience worsening weakness, inability to ambulate or walk, chest pain, shortness of breath, difficulty breathing, or other concerns.     ED Prescriptions  None    PDMP not reviewed this encounter.   Tish Men, NP 10/07/21 1048

## 2021-10-08 ENCOUNTER — Inpatient Hospital Stay (HOSPITAL_COMMUNITY)
Admission: EM | Admit: 2021-10-08 | Discharge: 2021-10-10 | DRG: 641 | Disposition: A | Discharge status: Home / Self Care | Payer: Medicare Other | Attending: Internal Medicine | Admitting: Internal Medicine

## 2021-10-08 ENCOUNTER — Encounter (HOSPITAL_COMMUNITY): Payer: Self-pay | Admitting: *Deleted

## 2021-10-08 ENCOUNTER — Other Ambulatory Visit: Payer: Self-pay

## 2021-10-08 ENCOUNTER — Telehealth (HOSPITAL_COMMUNITY): Payer: Self-pay | Admitting: Emergency Medicine

## 2021-10-08 DIAGNOSIS — I10 Essential (primary) hypertension: Secondary | ICD-10-CM

## 2021-10-08 DIAGNOSIS — R531 Weakness: Secondary | ICD-10-CM

## 2021-10-08 DIAGNOSIS — Z800 Family history of malignant neoplasm of digestive organs: Secondary | ICD-10-CM

## 2021-10-08 DIAGNOSIS — E871 Hypo-osmolality and hyponatremia: Secondary | ICD-10-CM | POA: Diagnosis not present

## 2021-10-08 DIAGNOSIS — Z79899 Other long term (current) drug therapy: Secondary | ICD-10-CM

## 2021-10-08 DIAGNOSIS — Z8042 Family history of malignant neoplasm of prostate: Secondary | ICD-10-CM

## 2021-10-08 DIAGNOSIS — K279 Peptic ulcer, site unspecified, unspecified as acute or chronic, without hemorrhage or perforation: Secondary | ICD-10-CM | POA: Diagnosis not present

## 2021-10-08 DIAGNOSIS — Z886 Allergy status to analgesic agent status: Secondary | ICD-10-CM

## 2021-10-08 DIAGNOSIS — Z20822 Contact with and (suspected) exposure to covid-19: Secondary | ICD-10-CM | POA: Diagnosis present

## 2021-10-08 DIAGNOSIS — Z8249 Family history of ischemic heart disease and other diseases of the circulatory system: Secondary | ICD-10-CM

## 2021-10-08 DIAGNOSIS — Z803 Family history of malignant neoplasm of breast: Secondary | ICD-10-CM

## 2021-10-08 DIAGNOSIS — Z7982 Long term (current) use of aspirin: Secondary | ICD-10-CM

## 2021-10-08 LAB — CBC WITH DIFFERENTIAL/PLATELET
Abs Immature Granulocytes: 0.02 10*3/uL (ref 0.00–0.07)
Basophils Absolute: 0.1 10*3/uL (ref 0.0–0.2)
Basophils Absolute: 0 10*3/uL (ref 0.0–0.1)
Basophils Relative: 1 %
Basos: 1 %
EOS (ABSOLUTE): 0 10*3/uL (ref 0.0–0.4)
Eos: 0 %
Eosinophils Absolute: 0 10*3/uL (ref 0.0–0.5)
Eosinophils Relative: 0 %
HCT: 34.8 % — ABNORMAL LOW (ref 36.0–46.0)
Hematocrit: 38.9 % (ref 34.0–46.6)
Hemoglobin: 13.5 g/dL (ref 11.1–15.9)
Hemoglobin: 12.6 g/dL (ref 12.0–15.0)
Immature Grans (Abs): 0 10*3/uL (ref 0.0–0.1)
Immature Granulocytes: 0 %
Immature Granulocytes: 1 %
Lymphocytes Absolute: 0.7 10*3/uL (ref 0.7–3.1)
Lymphocytes Relative: 29 %
Lymphs Abs: 1 10*3/uL (ref 0.7–4.0)
Lymphs: 21 %
MCH: 30.1 pg (ref 26.6–33.0)
MCH: 30.4 pg (ref 26.0–34.0)
MCHC: 34.7 g/dL (ref 31.5–35.7)
MCHC: 36.2 g/dL — ABNORMAL HIGH (ref 30.0–36.0)
MCV: 87 fL (ref 79–97)
MCV: 84.1 fL (ref 80.0–100.0)
Monocytes Absolute: 0.5 10*3/uL (ref 0.1–0.9)
Monocytes Absolute: 0.6 10*3/uL (ref 0.1–1.0)
Monocytes Relative: 18 %
Monocytes: 14 %
Neutro Abs: 1.8 10*3/uL (ref 1.7–7.7)
Neutrophils Absolute: 2.3 10*3/uL (ref 1.4–7.0)
Neutrophils Relative %: 51 %
Neutrophils: 64 %
Platelets: 222 10*3/uL (ref 150–450)
Platelets: 218 10*3/uL (ref 150–400)
RBC: 4.49 x10E6/uL (ref 3.77–5.28)
RBC: 4.14 MIL/uL (ref 3.87–5.11)
RDW: 13.2 % (ref 11.7–15.4)
RDW: 12.5 % (ref 11.5–15.5)
WBC: 3.5 10*3/uL (ref 3.4–10.8)
WBC: 3.4 10*3/uL — ABNORMAL LOW (ref 4.0–10.5)
nRBC: 0 % (ref 0.0–0.2)

## 2021-10-08 LAB — URINE CULTURE: Culture: <10000 — AB

## 2021-10-08 LAB — COMPREHENSIVE METABOLIC PANEL
ALT: 29 IU/L (ref 0–32)
AST: 41 IU/L — ABNORMAL HIGH (ref 0–40)
Albumin/Globulin Ratio: 1.8 (ref 1.2–2.2)
Albumin: 4.5 g/dL (ref 3.7–4.7)
Alkaline Phosphatase: 65 IU/L (ref 44–121)
BUN/Creatinine Ratio: 14 (ref 12–28)
BUN: 16 mg/dL (ref 8–27)
Bilirubin Total: 0.5 mg/dL (ref 0.0–1.2)
CO2: 22 mmol/L (ref 20–29)
Calcium: 9.3 mg/dL (ref 8.7–10.3)
Chloride: 85 mmol/L — ABNORMAL LOW (ref 96–106)
Creatinine, Ser: 1.17 mg/dL — ABNORMAL HIGH (ref 0.57–1.00)
Globulin, Total: 2.5 g/dL (ref 1.5–4.5)
Glucose: 119 mg/dL — ABNORMAL HIGH (ref 70–99)
Potassium: 4.3 mmol/L (ref 3.5–5.2)
Sodium: 123 mmol/L — ABNORMAL LOW (ref 134–144)
Total Protein: 7 g/dL (ref 6.0–8.5)
eGFR: 47 mL/min/{1.73_m2} — ABNORMAL LOW (ref >59)

## 2021-10-08 LAB — BASIC METABOLIC PANEL
Anion gap: 10 (ref 5–15)
BUN: 14 mg/dL (ref 8–23)
CO2: 26 mmol/L (ref 22–32)
Calcium: 8.8 mg/dL — ABNORMAL LOW (ref 8.9–10.3)
Chloride: 88 mmol/L — ABNORMAL LOW (ref 98–111)
Creatinine, Ser: 0.99 mg/dL (ref 0.44–1.00)
GFR, Estimated: 57 mL/min — ABNORMAL LOW (ref >60)
Glucose, Bld: 95 mg/dL (ref 70–99)
Potassium: 4.2 mmol/L (ref 3.5–5.1)
Sodium: 124 mmol/L — ABNORMAL LOW (ref 135–145)

## 2021-10-08 MED ORDER — ONDANSETRON HCL 4 MG PO TABS: 4.0000 mg | ORAL_TABLET | Freq: Four times a day (QID) | ORAL | Status: DC | PRN

## 2021-10-08 MED ORDER — ONDANSETRON HCL 4 MG/2ML IJ SOLN: 4.0000 mg | Freq: Four times a day (QID) | INTRAMUSCULAR | Status: DC | PRN

## 2021-10-08 MED ORDER — ENOXAPARIN SODIUM 40 MG/0.4ML IJ SOSY
40.0000 mg | PREFILLED_SYRINGE | INTRAMUSCULAR | Status: DC
  Administered 2021-10-08 – 2021-10-09 (×2): 40 mg via SUBCUTANEOUS
  Filled 2021-10-08 (×2): qty 0.4

## 2021-10-08 MED ORDER — SODIUM CHLORIDE 0.9 % IV SOLN: INTRAVENOUS | Status: AC

## 2021-10-08 MED ORDER — PANTOPRAZOLE SODIUM 20 MG PO TBEC
20.0000 mg | DELAYED_RELEASE_TABLET | Freq: Every day | ORAL | Status: DC
  Filled 2021-10-08 (×2): qty 1

## 2021-10-08 MED ORDER — SODIUM CHLORIDE 0.9 % IV BOLUS
1000.0000 mL | Freq: Once | INTRAVENOUS | Status: AC
  Administered 2021-10-08: 1000 mL via INTRAVENOUS

## 2021-10-08 MED ORDER — POLYETHYLENE GLYCOL 3350 17 G PO PACK: 17.0000 g | PACK | Freq: Every day | ORAL | Status: DC | PRN

## 2021-10-08 MED ORDER — ACETAMINOPHEN 325 MG PO TABS
650.0000 mg | ORAL_TABLET | Freq: Four times a day (QID) | ORAL | Status: DC | PRN
  Filled 2021-10-08: qty 2

## 2021-10-08 MED ORDER — ACETAMINOPHEN 650 MG RE SUPP: 650.0000 mg | Freq: Four times a day (QID) | RECTAL | Status: DC | PRN

## 2021-10-08 NOTE — Assessment & Plan Note (Signed)
Hold HCTZ for now. 

## 2021-10-08 NOTE — ED Provider Notes (Signed)
[facility] EMERGENCY DEPARTMENT Provider Note   CSN: [id number] Arrival date & time: 10/08/21  1333     History  Chief Complaint  Patient presents with   Abnormal Lab    Jaime Lin is a 81 y.o. female.  HPI   Patient with medical history including acid reflux, PUD, hypertension presents with complaints of low sodium.  Patient states starting Sunday she is not feeling well, states she felt very fatigued, and had multiple episodes of bowel movements, there no diarrhea  no melena hematochezia, denies any urinary symptoms, no stomach pains no nausea no vomiting, she denies any URI-like symptoms, states that she continues to feel tired and fatigue over the next couple days, she had went to her primary care doctor who obtained lab work shows that she had a low sodium and was sent here for further evaluation.  Patient's had no changes in her medications, she states that she takes half of HCTZ, has never had this in the past no chest pain no shortness of breath no headaches no change in vision no numbness or tingling arms or legs.    Home Medications Prior to Admission medications   Medication Sig Start Date End Date Taking? Authorizing Provider  Calcium Carbonate-Vitamin D3 (CALCIUM 600-D) 600-400 MG-UNIT TABS Take 1 tablet by mouth daily.   Yes [provider]  hydrochlorothiazide (HYDRODIURIL) 25 MG tablet Take 12.5 mg by mouth daily. 07/26/21  Yes [provider]  Multiple Vitamin (MULTIVITAMIN WITH MINERALS) TABS tablet Take 1 tablet by mouth daily.   Yes [provider]  pantoprazole (PROTONIX) 20 MG tablet Take 20 mg by mouth daily. 08/21/21  Yes [provider]      Allergies    Voltaren [diclofenac sodium]    Review of Systems   Review of Systems  Constitutional:  Positive for fatigue. Negative for chills and fever.  Respiratory:  Negative for shortness of breath.   Cardiovascular:  Negative for chest pain.  Gastrointestinal:  Negative for  abdominal pain.  Neurological:  Negative for headaches.    Physical Exam Updated Vital Signs BP (!) 169/82 (BP Location: Right Arm)   Pulse 68   Temp 97.9 F (36.6 C) (Oral)   Resp 19   SpO2 99%  Physical Exam Vitals and nursing note reviewed.  Constitutional:      General: She is not in acute distress.    Appearance: She is not ill-appearing.  HENT:     Head: Normocephalic and atraumatic.     Nose: No congestion.  Eyes:     Conjunctiva/sclera: Conjunctivae normal.  Cardiovascular:     Rate and Rhythm: Normal rate and regular rhythm.     Pulses: Normal pulses.     Heart sounds: No murmur heard.    No friction rub. No gallop.  Pulmonary:     Effort: No respiratory distress.     Breath sounds: No wheezing, rhonchi or rales.  Abdominal:     Palpations: Abdomen is soft.     Tenderness: There is no abdominal tenderness. There is no right CVA tenderness or left CVA tenderness.  Skin:    General: Skin is warm and dry.  Neurological:     Mental Status: She is alert.     Comments: No facial asymmetry no difficulty with word finding following two-step commands no unilateral weakness present.  Psychiatric:        Mood and Affect: Mood normal.     ED Results / Procedures / Treatments  Labs (all labs ordered are listed, but only abnormal results are displayed) Labs Reviewed  BASIC METABOLIC PANEL - Abnormal; Notable for the following components:      Result Value   Sodium 124 (*)    Chloride 88 (*)    Calcium 8.8 (*)    GFR, Estimated 57 (*)    All other components within normal limits  CBC WITH DIFFERENTIAL/PLATELET - Abnormal; Notable for the following components:   WBC 3.4 (*)    HCT 34.8 (*)    MCHC 36.2 (*)    All other components within normal limits  URINALYSIS, ROUTINE W REFLEX MICROSCOPIC    EKG None  Radiology DG Chest 2 View  Result Date: 10/07/2021 CLINICAL DATA:  Fever. EXAM: CHEST - 2 VIEW COMPARISON:  March 05, 2013. FINDINGS: The heart size  and mediastinal contours are within normal limits. Both lungs are clear. The visualized skeletal structures are unremarkable. IMPRESSION: No active cardiopulmonary disease. Electronically Signed   By: Marijo Conception M.D.   On: 10/07/2021 09:47    Procedures Procedures    Medications Ordered in ED Medications  sodium chloride 0.9 % bolus 1,000 mL (1,000 mLs Intravenous New Bag/Given 10/08/21 1741)    ED Course/ Medical Decision Making/ A&P                           Medical Decision Making Amount and/or Complexity of Data Reviewed Labs: ordered.  Risk Decision regarding hospitalization.   This patient presents to the ED for concern of hyponatremic, this involves an extensive number of treatment options, and is a complaint that carries with it a high risk of complications and morbidity.  The differential diagnosis includes metabolic derailments, CVA, sepsis, ACS    Additional history obtained:  Additional history obtained from daughter at bedside External records from outside source obtained and reviewed including previous primary care note   Co morbidities that complicate the patient evaluation  Hypertension  Social Determinants of Health:  N/A    Lab Tests:  I Ordered, and personally interpreted labs.  The pertinent results include: CBC shows leukocytopenia with white count 3.4, BMP shows sodium of 124 chloride 88 calcium 8.8, baseline sodium appears to be 133, UA pending   Imaging Studies ordered:  I ordered imaging studies including N/A I independently visualized and interpreted imaging which showed N/A I agree with the radiologist interpretation   Cardiac Monitoring:  The patient was maintained on a cardiac monitor.  I personally viewed and interpreted the cardiac monitored which showed an underlying rhythm of: Pending   Medicines ordered and prescription drug management:  I ordered medication including fluids I have reviewed the patients home medicines  and have made adjustments as needed  Critical Interventions:  N/A   Reevaluation:  Presents with fatigue and low sodium, benign physical exam, suspect sodium is still low at this time, will obtain basic lab work and reassess  Lab work shows hyponatremia of 124, continues to feel fatigued, has been drinking again without much success and raising her sodium, will recommend admission for further observation patient agreement this plan  Consultations Obtained:  I requested consultation with the spoke with Dr Su Ley ,  and discussed lab and imaging findings as well as pertinent plan - they recommend: will admit the patient.     Test Considered:  CT head-deferred my suspicion for intracranial head bleed or CVA is low no focal deficit present my exam presentation is atypical etiology.  Rule out low suspicion for CVA or intracranial head bleed as patient denies change in vision, paresthesias or weakness to upper lower extremities, no neuro deficits noted on exam.  Low suspicion for ACS or arrhythmias as patient denies chest pain, shortness of breath, no hypoperfusion or fluid overload on exam, vital signs are reassuring.  low suspicion for systemic infection as patient is nontoxic-appearing, vital signs reassuring, no obvious source infection noted on exam.      Dispostion and problem list  After consideration of the diagnostic results and the patients response to treatment, I feel that the patent would benefit from admission.  Hyponatremia-likely multifactorial, increased bowel movements as well as being on HCTZ, will provide fluid resuscitation patient need further monitoring.            Final Clinical Impression(s) / ED Diagnoses Final diagnoses:  Hyponatremia  Weakness    Rx / DC Orders ED Discharge Orders     None         Aron Baba 10/08/21 Skip Estimable, MD 10/10/21 [phone]

## 2021-10-08 NOTE — ED Triage Notes (Signed)
Pt sent here for abnormal Na level.

## 2021-10-08 NOTE — Telephone Encounter (Signed)
Patient is being discharged from the Urgent Care and sent to the Emergency Department via private vehicle, after results returned. Per Dr. Mannie Stabile, patient is in need of higher level of care due to hyponatremia. Patient is aware and verbalizes understanding of plan of care.

## 2021-10-08 NOTE — Assessment & Plan Note (Signed)
Resume pantoprazole

## 2021-10-08 NOTE — Assessment & Plan Note (Addendum)
Symptomatic.  Sodium 124.  Last sodium on file from 7 years ago - 132.  Patient is on HCTZ.  Drinks 3 to 4 L of fluids daily, 2 liters of which is water.  The other 1-2L is Gatorade, which has sodium content.  She reports good oral intake, some recent multiple bowel movements and fever of 100.8 - 2 days ago.  Afebrile currently, no leukocytosis.  Abdominal exam is benign.  Differentials include SIADH, polydipsia, HCTZ induced. -Obtain urine sodium -Urine osmolality -Serum osmolality -  Add on - TSH -1 L bolus given, continue N/s 100cc/hr x 20hrs - Hold HCTZ - COVID test yesterday negative- 9/26

## 2021-10-08 NOTE — H&P (Addendum)
History and Physical    Jaime Lin:[id number] DOB: 05/10/1940 DOA: 10/08/2021  PCP: Gaynelle Arabian, MD   Patient coming from: Home  I have personally briefly reviewed patient's old medical records in [facility]  Chief Complaint: Low Sodium  HPI: Jaime Lin is a 81 y.o. female with medical history significant for peptic ulcer disease, acid reflux and hypertension. Patient presented to the ED with complaints of feeling fatigued, and unwell over the past 3 days.  Patient's daughter is at bedside.  She reports multiple bowel movements yesterday, but this was normal stool consistency.  She denies nausea, vomiting and reports good oral intake.  No headache . Family denies change in mental status, no seizures. Patient reports over the past several months she has had intermittent transient dizziness but would intermittently alcohol.  She reports a fever of up to 100.8 - 2 days ago, none since.  No cough no difficulty breathing.  No chest pain.  She denies urinary symptoms. She drinks about 2 L of water daily, and 1-2 big bottles ( ?? 1L ) of Gatorade daily.  She reports because of her dizziness she was told to stay hydrated.  She takes HCTZ 12.5 mg daily and has taken this for at least 5 years.  Patient had blood work done by outpatient provider and was referred to the ED due to hyponatremia.  ED Course: Stable vitals.  Sodium 124.  1 L bolus given.  Review of Systems: As per HPI all other systems reviewed and negative.  Past Medical History:  Diagnosis Date   Acid reflux    PUD (peptic ulcer disease)     Past Surgical History:  Procedure Laterality Date   ABDOMINAL HYSTERECTOMY     APPENDECTOMY     CHOLECYSTECTOMY     COLONOSCOPY     COLONOSCOPY N/A 09/05/2015   Procedure: COLONOSCOPY;  Surgeon: Rogene Houston, MD;  Location: [facility];  Service: Endoscopy;  Laterality: N/A;  925     reports that she has never smoked. She has never used smokeless tobacco. She  reports that she does not drink alcohol and does not use drugs.  Allergies  Allergen Reactions   Voltaren [Diclofenac Sodium]     Upset wstomach    Family History  Problem Relation Age of Onset   Cirrhosis Mother    Hypertension Father    Stomach cancer Father    Coronary artery disease Sister    Prostate cancer Brother    Sleep apnea Brother    Breast cancer Sister    Prior to Admission medications   Medication Sig Start Date End Date Taking? Authorizing Provider  Calcium Carbonate-Vitamin D3 (CALCIUM 600-D) 600-400 MG-UNIT TABS Take 1 tablet by mouth daily.   Yes [provider]  hydrochlorothiazide (HYDRODIURIL) 25 MG tablet Take 12.5 mg by mouth daily. 07/26/21  Yes [provider]  Multiple Vitamin (MULTIVITAMIN WITH MINERALS) TABS tablet Take 1 tablet by mouth daily.   Yes [provider]  pantoprazole (PROTONIX) 20 MG tablet Take 20 mg by mouth daily. 08/21/21  Yes [provider]    Physical Exam: Vitals:   10/08/21 1342 10/08/21 1344 10/08/21 1730 10/08/21 1741  BP:  126/74 (!) 169/82   Pulse: 71  68   Resp: 16  19   Temp: 97.8 F (36.6 C)   97.9 F (36.6 C)  TempSrc: Oral   Oral  SpO2: 100%  99%     Constitutional: NAD, calm, comfortable Vitals:  10/08/21 1342 10/08/21 1344 10/08/21 1730 10/08/21 1741  BP:  126/74 (!) 169/82   Pulse: 71  68   Resp: 16  19   Temp: 97.8 F (36.6 C)   97.9 F (36.6 C)  TempSrc: Oral   Oral  SpO2: 100%  99%    Eyes: PERRL, lids and conjunctivae normal ENMT: Mucous membranes are moist.   Neck: normal, supple, no masses, no thyromegaly Respiratory: clear to auscultation bilaterally, no wheezing, no crackles. Normal respiratory effort. No accessory muscle use.  Cardiovascular: Regular rate and rhythm, no murmurs / rubs / gallops. No extremity edema.  Extremities warm Abdomen: no tenderness, no masses palpated. No hepatosplenomegaly. Bowel sounds positive.  Musculoskeletal: no clubbing /  cyanosis. No joint deformity upper and lower extremities. Good ROM, no contractures. Normal muscle tone.  Skin: no rashes, lesions, ulcers. No induration Neurologic:  5/5 strength in all extremities speech fluent , facial asymmetry Psychiatric: Normal judgment and insight. Alert and oriented x 3. Normal mood.   Labs on Admission: I have personally reviewed following labs and imaging studies  CBC: Recent Labs  Lab 10/07/21 0924 10/08/21 1637  WBC 3.5 3.4*  NEUTROABS 2.3 1.8  HGB 13.5 12.6  HCT 38.9 34.8*  MCV 87 84.1  PLT 222 409   Basic Metabolic Panel: Recent Labs  Lab 10/07/21 0924 10/08/21 1637  NA 123* 124*  K 4.3 4.2  CL 85* 88*  CO2 22 26  GLUCOSE 119* 95  BUN 16 14  CREATININE 1.17* 0.99  CALCIUM 9.3 8.8*   GFR: CrCl cannot be calculated (Unknown ideal weight.). Liver Function Tests: Recent Labs  Lab 10/07/21 0924  AST 41*  ALT 29  ALKPHOS 65  BILITOT 0.5  PROT 7.0  ALBUMIN 4.5    Radiological Exams on Admission: DG Chest 2 View  Result Date: 10/07/2021 CLINICAL DATA:  Fever. EXAM: CHEST - 2 VIEW COMPARISON:  March 05, 2013. FINDINGS: The heart size and mediastinal contours are within normal limits. Both lungs are clear. The visualized skeletal structures are unremarkable. IMPRESSION: No active cardiopulmonary disease. Electronically Signed   By: Marijo Conception M.D.   On: 10/07/2021 09:47    EKG: Pending  Assessment/Plan Principal Problem:   Hyponatremia Active Problems:   HTN (hypertension)   PUD (peptic ulcer disease)   Assessment and Plan: * Hyponatremia Symptomatic.  Sodium 124.  Last sodium on file from 7 years ago - 132.  Patient is on HCTZ.  Drinks 3 to 4 L of fluids daily, 2 liters of which is water.  The other 1-2L is Gatorade, which has sodium content.  She reports good oral intake, some recent multiple bowel movements and fever of 100.8 - 2 days ago.  Afebrile currently, no leukocytosis.  Abdominal exam is benign.  Differentials  include SIADH, polydipsia, HCTZ induced. -Obtain urine sodium -Urine osmolality -Serum osmolality -  Add on - TSH -1 L bolus given, continue N/s 100cc/hr x 20hrs - Hold HCTZ - COVID test yesterday negative- 9/26  PUD (peptic ulcer disease) Resume pantoprazole  HTN (hypertension) Hold HCTZ for now   DVT prophylaxis:  Lovenox Code Status: Full Family Communication:  daughter at bedside Disposition Plan: ~ 1- 2 days Consults called: None  Admission status: Obs Med surg  Author: Bethena Roys, MD 10/08/2021 9:13 PM  For on call review [email or web address].

## 2021-10-09 DIAGNOSIS — Z8042 Family history of malignant neoplasm of prostate: Secondary | ICD-10-CM | POA: Diagnosis not present

## 2021-10-09 DIAGNOSIS — Z803 Family history of malignant neoplasm of breast: Secondary | ICD-10-CM | POA: Diagnosis not present

## 2021-10-09 DIAGNOSIS — Z8249 Family history of ischemic heart disease and other diseases of the circulatory system: Secondary | ICD-10-CM | POA: Diagnosis not present

## 2021-10-09 DIAGNOSIS — Z79899 Other long term (current) drug therapy: Secondary | ICD-10-CM | POA: Diagnosis not present

## 2021-10-09 DIAGNOSIS — Z886 Allergy status to analgesic agent status: Secondary | ICD-10-CM | POA: Diagnosis not present

## 2021-10-09 DIAGNOSIS — Z20822 Contact with and (suspected) exposure to covid-19: Secondary | ICD-10-CM | POA: Diagnosis present

## 2021-10-09 DIAGNOSIS — I10 Essential (primary) hypertension: Secondary | ICD-10-CM | POA: Diagnosis present

## 2021-10-09 DIAGNOSIS — K279 Peptic ulcer, site unspecified, unspecified as acute or chronic, without hemorrhage or perforation: Secondary | ICD-10-CM | POA: Diagnosis present

## 2021-10-09 DIAGNOSIS — E871 Hypo-osmolality and hyponatremia: Secondary | ICD-10-CM | POA: Diagnosis present

## 2021-10-09 DIAGNOSIS — Z800 Family history of malignant neoplasm of digestive organs: Secondary | ICD-10-CM | POA: Diagnosis not present

## 2021-10-09 DIAGNOSIS — Z7982 Long term (current) use of aspirin: Secondary | ICD-10-CM | POA: Diagnosis not present

## 2021-10-09 LAB — TSH: TSH: 3.077 u[IU]/mL (ref 0.350–4.500)

## 2021-10-09 LAB — OSMOLALITY: Osmolality: 259 mOsm/kg — ABNORMAL LOW (ref 275–295)

## 2021-10-09 LAB — BASIC METABOLIC PANEL
Anion gap: 6 (ref 5–15)
BUN: 12 mg/dL (ref 8–23)
CO2: 25 mmol/L (ref 22–32)
Calcium: 8.1 mg/dL — ABNORMAL LOW (ref 8.9–10.3)
Chloride: 95 mmol/L — ABNORMAL LOW (ref 98–111)
Creatinine, Ser: 0.96 mg/dL (ref 0.44–1.00)
GFR, Estimated: 59 mL/min — ABNORMAL LOW (ref >60)
Glucose, Bld: 88 mg/dL (ref 70–99)
Potassium: 4 mmol/L (ref 3.5–5.1)
Sodium: 126 mmol/L — ABNORMAL LOW (ref 135–145)

## 2021-10-09 LAB — OSMOLALITY, URINE: Osmolality, Ur: 188 mOsm/kg — ABNORMAL LOW (ref 300–900)

## 2021-10-09 LAB — SODIUM, URINE, RANDOM: Sodium, Ur: 41 mmol/L

## 2021-10-09 MED ORDER — PANTOPRAZOLE SODIUM 40 MG PO TBEC
40.0000 mg | DELAYED_RELEASE_TABLET | Freq: Every day | ORAL | Status: DC
  Administered 2021-10-09 – 2021-10-10 (×2): 40 mg via ORAL
  Filled 2021-10-09 (×2): qty 1

## 2021-10-09 MED ORDER — SODIUM CHLORIDE 1 G PO TABS
2.0000 g | ORAL_TABLET | Freq: Two times a day (BID) | ORAL | Status: DC
  Administered 2021-10-09 – 2021-10-10 (×2): 2 g via ORAL
  Filled 2021-10-09 (×2): qty 2

## 2021-10-09 NOTE — Progress Notes (Signed)
PROGRESS NOTE  Jaime Lin  KGU:[id number] DOB: [birth year] DOA: 10/08/2021 PCP: Gaynelle Arabian, MD   Brief Narrative:  Patient is a 81 year old female female with history of peptic ulcer disease, GERD, hypertension who presents to the emergency department with complaints of fatigue, feeling unwell.  No history of seizure or confusion but complains of intermittent dizziness which was transient.  Reports drinking 3 to 4 L of fluid sometimes more than that.  She reported that she was told to stay hydrated.  Also takes hydrochlorothiazide 12.5 mg daily for last 5 years.  Outpatient blood work done showed low sodium level so she was referred to the emergency department by her PCP.  On presentation she was hemodynamically stable, sodium of 124.  Patient was admitted for further evaluation  Assessment & Plan:  Principal Problem:   Hyponatremia Active Problems:   HTN (hypertension)   PUD (peptic ulcer disease)  Hyponatremia: Symptomatic with weakness, feeling unwell.  Patient is on hydrochlorothiazide at home.  Drinks 3 to 4 L of fluid.  Had multiple bowel movements at home recently with normal consistency.  Reports good oral intake.  Urine sodium is 41. Hyponatremia could be from combination of  polydipsia versus hydrochlorothiazide.  Restrict free water intake.  Started on normal saline here. Also started on salt tablets.  Probably will discontinue hydrochlorothiazide permanently.  TSH is normal.  History of PUD: Continue Protonix  Hypertension: Currently stable.  Continue to hold hydrochlorothiazide         DVT prophylaxis:enoxaparin (LOVENOX) injection 40 mg Start: 10/08/21 2215     Code Status: Full Code  Family Communication: Friend/family bedside  Patient status:Obs  Patient is from :Home  Anticipated discharge SE:GBTD  Estimated DC date:1-2 days   Consultants: None  Procedures:None  Antimicrobials:  Anti-infectives (From admission, onward)    None        Subjective:  Patient seen and examined the bedside today.  Hemodynamically stable.  Lying in bed.  Comfortable.  Weakness has improved.  No new complaints Long discussion with daughter at the bedside about the fluid intake, effect of hydrochlorothiazide.  Objective: Vitals:   10/08/21 2041 10/09/21 0058 10/09/21 0522 10/09/21 0800  BP: (!) 131/90 139/66 (!) 150/67 130/61  Pulse: 75 67 63 73  Resp: '19 19 19 20  '$ Temp: 98.7 F (37.1 C) 98.3 F (36.8 C) 98.3 F (36.8 C) 98.1 F (36.7 C)  TempSrc: Oral Oral Oral Oral  SpO2: 100% 99% 97% 98%  Weight:      Height:        Intake/Output Summary (Last 24 hours) at 10/09/2021 0926 Last data filed at 10/09/2021 0900 Gross per 24 hour  Intake 1740 ml  Output --  Net 1740 ml   Filed Weights   10/08/21 2033  Weight: 80.5 kg    Examination:  General exam: Overall comfortable, not in distress HEENT: PERRL Respiratory system:  no wheezes or crackles  Cardiovascular system: S1 & S2 heard, RRR.  Gastrointestinal system: Abdomen is nondistended, soft and nontender. Central nervous system: Alert and oriented Extremities: No edema, no clubbing ,no cyanosis Skin: No rashes, no ulcers,no icterus     Data Reviewed: I have personally reviewed following labs and imaging studies  CBC: Recent Labs  Lab 10/07/21 0924 10/08/21 1637  WBC 3.5 3.4*  NEUTROABS 2.3 1.8  HGB 13.5 12.6  HCT 38.9 34.8*  MCV 87 84.1  PLT 222 176   Basic Metabolic Panel: Recent Labs  Lab 10/07/21 0924 10/08/21 1637 10/09/21 0307  NA 123* 124* 126*  K 4.3 4.2 4.0  CL 85* 88* 95*  CO2 '22 26 25  '$ GLUCOSE 119* 95 88  BUN '16 14 12  '$ CREATININE 1.17* 0.99 0.96  CALCIUM 9.3 8.8* 8.1*     Recent Results (from the past 240 hour(s))  Resp Panel by RT-PCR (Flu A&B, Covid) Anterior Nasal Swab     Status: None   Collection Time: 10/07/21  9:24 AM   Specimen: Anterior Nasal Swab  Result Value Ref Range Status   SARS Coronavirus 2 by RT PCR NEGATIVE  NEGATIVE Final    Comment: (NOTE) SARS-CoV-2 target nucleic acids are NOT DETECTED.  The SARS-CoV-2 RNA is generally detectable in upper respiratory specimens during the acute phase of infection. The lowest concentration of SARS-CoV-2 viral copies this assay can detect is 138 copies/mL. A negative result does not preclude SARS-Cov-2 infection and should not be used as the sole basis for treatment or other patient management decisions. A negative result may occur with  improper specimen collection/handling, submission of specimen other than nasopharyngeal swab, presence of viral mutation(s) within the areas targeted by this assay, and inadequate number of viral copies(<138 copies/mL). A negative result must be combined with clinical observations, patient history, and epidemiological information. The expected result is Negative.  Fact Sheet for Patients:  EntrepreneurPulse.com.au  Fact Sheet for Healthcare Providers:  IncredibleEmployment.be  This test is no t yet approved or cleared by the [place] FDA and  has been authorized for detection and/or diagnosis of SARS-CoV-2 by FDA under an Emergency Use Authorization (EUA). This EUA will remain  in effect (meaning this test can be used) for the duration of the COVID-19 declaration under Section 564(b)(1) of the Act, 21 U.S.C.section 360bbb-3(b)(1), unless the authorization is terminated  or revoked sooner.       Influenza A by PCR NEGATIVE NEGATIVE Final   Influenza B by PCR NEGATIVE NEGATIVE Final    Comment: (NOTE) The Xpert Xpress SARS-CoV-2/FLU/RSV plus assay is intended as an aid in the diagnosis of influenza from Nasopharyngeal swab specimens and should not be used as a sole basis for treatment. Nasal washings and aspirates are unacceptable for Xpert Xpress SARS-CoV-2/FLU/RSV testing.  Fact Sheet for Patients: EntrepreneurPulse.com.au  Fact Sheet for Healthcare  Providers: IncredibleEmployment.be  This test is not yet approved or cleared by the [place] FDA and has been authorized for detection and/or diagnosis of SARS-CoV-2 by FDA under an Emergency Use Authorization (EUA). This EUA will remain in effect (meaning this test can be used) for the duration of the COVID-19 declaration under Section 564(b)(1) of the Act, 21 U.S.C. section 360bbb-3(b)(1), unless the authorization is terminated or revoked.  Performed at [facility], [place] [address], [place], [place] [address]   Urine Culture     Status: Abnormal   Collection Time: 10/07/21 10:36 AM   Specimen: Urine, Clean Catch  Result Value Ref Range Status   Specimen Description   Final    URINE, CLEAN CATCH Performed at [facility], [address], [place], [place] [address]    Special Requests   Final    NONE Performed at [facility], [address], [place], [place] [address]    Culture (A)  Final    <10,000 COLONIES/mL INSIGNIFICANT GROWTH Performed at [facility] [address], [place], [place] [address]    Report Status 10/08/2021 FINAL  Final     Radiology Studies: DG Chest 2 View  Result Date: 10/07/2021 CLINICAL DATA:  Fever. EXAM: CHEST - 2 VIEW COMPARISON:  March 05, 2013. FINDINGS: The heart size and mediastinal contours are within normal limits. Both lungs are clear. The visualized skeletal structures are unremarkable. IMPRESSION: No active cardiopulmonary disease. Electronically Signed   By: Marijo Conception M.D.   On: 10/07/2021 09:47    Scheduled Meds:  enoxaparin (LOVENOX) injection  40 mg Subcutaneous Q24H   pantoprazole  40 mg Oral Daily   Continuous Infusions:  sodium chloride 100 mL/hr at 10/08/21 2139     LOS: 0 days   Shelly Coss, MD [facility] P9/28/2023, 9:26 AM

## 2021-10-09 NOTE — Progress Notes (Signed)
  Transition of Care [facility]) Screening Note   Patient Details  Name: Jaime Lin Date of Birth: [birth year]   [facility] CM/SW Contact:    Iona Beard, [place] Phone Number: 10/09/2021, 12:16 PM    Transition of Care Department [facility]) has reviewed patient and no TOC needs have been identified at this time. We will continue to monitor patient advancement through interdisciplinary progression rounds. If new patient transition needs arise, please place a TOC consult.

## 2021-10-10 DIAGNOSIS — E871 Hypo-osmolality and hyponatremia: Secondary | ICD-10-CM | POA: Diagnosis not present

## 2021-10-10 LAB — BASIC METABOLIC PANEL
Anion gap: 6 (ref 5–15)
BUN: 9 mg/dL (ref 8–23)
CO2: 23 mmol/L (ref 22–32)
Calcium: 8.5 mg/dL — ABNORMAL LOW (ref 8.9–10.3)
Chloride: 104 mmol/L (ref 98–111)
Creatinine, Ser: 0.93 mg/dL (ref 0.44–1.00)
GFR, Estimated: >60 mL/min (ref >60)
Glucose, Bld: 104 mg/dL — ABNORMAL HIGH (ref 70–99)
Potassium: 4 mmol/L (ref 3.5–5.1)
Sodium: 133 mmol/L — ABNORMAL LOW (ref 135–145)

## 2021-10-10 MED ORDER — SODIUM CHLORIDE 1 G PO TABS: 1.0000 g | ORAL_TABLET | Freq: Two times a day (BID) | ORAL | 0 refills | Status: AC

## 2021-10-10 NOTE — Discharge Summary (Signed)
Physician Discharge Summary  Jaime Lin ZDG:[id number] DOB: 08-05-1940 DOA: 10/08/2021  PCP: Gaynelle Arabian, MD  Admit date: 10/08/2021 Discharge date: 10/10/2021  Admitted From: Home Disposition:  Home  Discharge Condition:Stable CODE STATUS:FULL Diet recommendation: Regular  Brief/Interim Summary: Patient is a 81 year old female female with history of peptic ulcer disease, GERD, hypertension who presents to the emergency department with complaints of fatigue, feeling unwell.  No history of seizure or confusion but complains of intermittent dizziness which was transient.  Reports drinking 3 to 4 L of fluid sometimes more than that.  She reported that she was told to stay hydrated.  Also takes hydrochlorothiazide 12.5 mg daily for last 5 years.  Outpatient blood work done showed low sodium level so she was referred to the emergency department by her PCP.  On presentation she was hemodynamically stable, sodium of 124.  Patient was admitted for further evaluation.  Patient was started on gentle IV fluids and sodium tablets with improve her sodium level.  Sodium level at 133.  Medically stable for discharge to home.  Following problems were addressed during her hospitalization:  Hyponatremia: Symptomatic with weakness, feeling unwell.  Patient is on hydrochlorothiazide at home.  Drinks 3 to 4 L of fluid.    Reports good oral intake.  Urine sodium was 41. Hyponatremia could be from combination of  polydipsia , hydrochlorothiazide.  Started on normal saline here. Also started on salt tablets.   Sodium level of 133 today.  Patient has been instructed to avoid excess free water at home.  We will discontinue hydrochlorothiazide permanently.  TSH is normal. Continue salt tablets for few more days.   History of PUD: Continue Protonix   Hypertension: Currently stable.HCTZ d/ced  Discharge Diagnoses:  Principal Problem:   Hyponatremia Active Problems:   HTN (hypertension)   PUD (peptic ulcer  disease)    Discharge Instructions  Discharge Instructions     Diet general   Complete by: As directed    Discharge instructions   Complete by: As directed    1)Please take prescribed medication as instructed 2)Avoid excessive fluid intake 3)We have discontinued hydrochlorothiazide, monitor blood pressure at home 4)Follow up with your PCP in a week.  Do a BMP test during the follow-up to check your sodium level.   Increase activity slowly   Complete by: As directed       Allergies as of 10/10/2021       Reactions   Voltaren [diclofenac Sodium]    Upset wstomach        Medication List     STOP taking these medications    hydrochlorothiazide 25 MG tablet Commonly known as: HYDRODIURIL       TAKE these medications    Calcium 600-D 600-400 MG-UNIT Tabs Generic drug: Calcium Carbonate-Vitamin D3 Take 1 tablet by mouth daily.   multivitamin with minerals Tabs tablet Take 1 tablet by mouth daily.   pantoprazole 20 MG tablet Commonly known as: PROTONIX Take 20 mg by mouth daily.   sodium chloride 1 g tablet Take 1 tablet (1 g total) by mouth 2 (two) times daily with a meal for 5 days.        Follow-up Information     Gaynelle Arabian, MD. Schedule an appointment as soon as possible for a visit in 1 week(s).   Specialty: Family Medicine Contact information: [address], [place] [address] [phone]                Allergies  Allergen Reactions   Voltaren [Diclofenac Sodium]     Upset wstomach    Consultations: None   Procedures/Studies: DG Chest 2 View  Result Date: 10/07/2021 CLINICAL DATA:  Fever. EXAM: CHEST - 2 VIEW COMPARISON:  March 05, 2013. FINDINGS: The heart size and mediastinal contours are within normal limits. Both lungs are clear. The visualized skeletal structures are unremarkable. IMPRESSION: No active cardiopulmonary disease. Electronically Signed   By: Marijo Conception M.D.   On: 10/07/2021 09:47       Subjective: Patient seen and examined at the bedside today.  Hemodynamically stable for discharge.  Discharge Exam: Vitals:   10/09/21 2112 10/10/21 0620  BP: (!) 131/52 136/65  Pulse: 67 67  Resp: 17 16  Temp: 98.3 F (36.8 C) 97.8 F (36.6 C)  SpO2: 97% 98%   Vitals:   10/09/21 0800 10/09/21 1411 10/09/21 2112 10/10/21 0620  BP: 130/61 99/79 (!) 131/52 136/65  Pulse: 73 67 67 67  Resp: '20 15 17 16  '$ Temp: 98.1 F (36.7 C) 99.6 F (37.6 C) 98.3 F (36.8 C) 97.8 F (36.6 C)  TempSrc: Oral Oral Oral Oral  SpO2: 98% 98% 97% 98%  Weight:      Height:        General: Pt is alert, awake, not in acute distress Cardiovascular: RRR, S1/S2 +, no rubs, no gallops Respiratory: CTA bilaterally, no wheezing, no rhonchi Abdominal: Soft, NT, ND, bowel sounds + Extremities: no edema, no cyanosis    The results of significant diagnostics from this hospitalization (including imaging, microbiology, ancillary and laboratory) are listed below for reference.     Microbiology: Recent Results (from the past 240 hour(s))  Resp Panel by RT-PCR (Flu A&B, Covid) Anterior Nasal Swab     Status: None   Collection Time: 10/07/21  9:24 AM   Specimen: Anterior Nasal Swab  Result Value Ref Range Status   SARS Coronavirus 2 by RT PCR NEGATIVE NEGATIVE Final    Comment: (NOTE) SARS-CoV-2 target nucleic acids are NOT DETECTED.  The SARS-CoV-2 RNA is generally detectable in upper respiratory specimens during the acute phase of infection. The lowest concentration of SARS-CoV-2 viral copies this assay can detect is 138 copies/mL. A negative result does not preclude SARS-Cov-2 infection and should not be used as the sole basis for treatment or other patient management decisions. A negative result may occur with  improper specimen collection/handling, submission of specimen other than nasopharyngeal swab, presence of viral mutation(s) within the areas targeted by this assay, and inadequate  number of viral copies(<138 copies/mL). A negative result must be combined with clinical observations, patient history, and epidemiological information. The expected result is Negative.  Fact Sheet for Patients:  EntrepreneurPulse.com.au  Fact Sheet for Healthcare Providers:  IncredibleEmployment.be  This test is no t yet approved or cleared by the [place] FDA and  has been authorized for detection and/or diagnosis of SARS-CoV-2 by FDA under an Emergency Use Authorization (EUA). This EUA will remain  in effect (meaning this test can be used) for the duration of the COVID-19 declaration under Section 564(b)(1) of the Act, 21 U.S.C.section 360bbb-3(b)(1), unless the authorization is terminated  or revoked sooner.       Influenza A by PCR NEGATIVE NEGATIVE Final   Influenza B by PCR NEGATIVE NEGATIVE Final    Comment: (NOTE) The Xpert Xpress SARS-CoV-2/FLU/RSV plus assay is intended as an aid in the diagnosis of influenza from Nasopharyngeal swab specimens and should not be used as a sole  basis for treatment. Nasal washings and aspirates are unacceptable for Xpert Xpress SARS-CoV-2/FLU/RSV testing.  Fact Sheet for Patients: EntrepreneurPulse.com.au  Fact Sheet for Healthcare Providers: IncredibleEmployment.be  This test is not yet approved or cleared by the [place] FDA and has been authorized for detection and/or diagnosis of SARS-CoV-2 by FDA under an Emergency Use Authorization (EUA). This EUA will remain in effect (meaning this test can be used) for the duration of the COVID-19 declaration under Section 564(b)(1) of the Act, 21 U.S.C. section 360bbb-3(b)(1), unless the authorization is terminated or revoked.  Performed at [facility], [place] [address] [place], [place] [address]   Urine Culture     Status: Abnormal   Collection Time: 10/07/21 10:36 AM   Specimen: Urine, Clean Catch   Result Value Ref Range Status   Specimen Description   Final    URINE, CLEAN CATCH Performed at [facility], [address] [place], [place] [address]    Special Requests   Final    NONE Performed at [facility], [address], [place], [place] [address]    Culture (A)  Final    <10,000 COLONIES/mL INSIGNIFICANT GROWTH Performed at [facility] [address], [place], [place] [address]    Report Status 10/08/2021 FINAL  Final     Labs: BNP (last 3 results) No results for input(s): "BNP" in the last 8760 hours. Basic Metabolic Panel: Recent Labs  Lab 10/07/21 0924 10/08/21 1637 10/09/21 0307 10/10/21 0320  NA 123* 124* 126* 133*  K 4.3 4.2 4.0 4.0  CL 85* 88* 95* 104  CO2 '22 26 25 23  '$ GLUCOSE 119* 95 88 104*  BUN '16 14 12 9  '$ CREATININE 1.17* 0.99 0.96 0.93  CALCIUM 9.3 8.8* 8.1* 8.5*   Liver Function Tests: Recent Labs  Lab 10/07/21 0924  AST 41*  ALT 29  ALKPHOS 65  BILITOT 0.5  PROT 7.0  ALBUMIN 4.5   No results for input(s): "LIPASE", "AMYLASE" in the last 168 hours. No results for input(s): "AMMONIA" in the last 168 hours. CBC: Recent Labs  Lab 10/07/21 0924 10/08/21 1637  WBC 3.5 3.4*  NEUTROABS 2.3 1.8  HGB 13.5 12.6  HCT 38.9 34.8*  MCV 87 84.1  PLT 222 218   Cardiac Enzymes: No results for input(s): "CKTOTAL", "CKMB", "CKMBINDEX", "TROPONINI" in the last 168 hours. BNP: Invalid input(s): "POCBNP" CBG: No results for input(s): "GLUCAP" in the last 168 hours. D-Dimer No results for input(s): "DDIMER" in the last 72 hours. Hgb A1c No results for input(s): "HGBA1C" in the last 72 hours. Lipid Profile No results for input(s): "CHOL", "HDL", "LDLCALC", "TRIG", "CHOLHDL", "LDLDIRECT" in the last 72 hours. Thyroid function studies Recent Labs    10/09/21 0307  TSH 3.077   Anemia work up No results for input(s): "VITAMINB12", "FOLATE", "FERRITIN", "TIBC", "IRON", "RETICCTPCT" in the last 72 hours. Urinalysis    Component  Value Date/Time   COLORURINE YELLOW 03/05/2013 1142   APPEARANCEUR CLEAR 03/05/2013 1142   LABSPEC 1.010 03/05/2013 1142   PHURINE 5.5 03/05/2013 1142   GLUCOSEU NEGATIVE 03/05/2013 1142   HGBUR NEGATIVE 03/05/2013 1142   BILIRUBINUR negative 10/07/2021 0924   KETONESUR trace (5) (A) 10/07/2021 0924   KETONESUR NEGATIVE 03/05/2013 1142   PROTEINUR =30 (A) 10/07/2021 0924   PROTEINUR NEGATIVE 03/05/2013 1142   UROBILINOGEN 1.0 10/07/2021 0924   UROBILINOGEN 0.2 03/05/2013 1142   NITRITE Negative 10/07/2021 0924   NITRITE NEGATIVE 03/05/2013 1142   LEUKOCYTESUR Small (1+) (  A) 10/07/2021 0924   Sepsis Labs Recent Labs  Lab 10/07/21 0924 10/08/21 1637  WBC 3.5 3.4*   Microbiology Recent Results (from the past 240 hour(s))  Resp Panel by RT-PCR (Flu A&B, Covid) Anterior Nasal Swab     Status: None   Collection Time: 10/07/21  9:24 AM   Specimen: Anterior Nasal Swab  Result Value Ref Range Status   SARS Coronavirus 2 by RT PCR NEGATIVE NEGATIVE Final    Comment: (NOTE) SARS-CoV-2 target nucleic acids are NOT DETECTED.  The SARS-CoV-2 RNA is generally detectable in upper respiratory specimens during the acute phase of infection. The lowest concentration of SARS-CoV-2 viral copies this assay can detect is 138 copies/mL. A negative result does not preclude SARS-Cov-2 infection and should not be used as the sole basis for treatment or other patient management decisions. A negative result may occur with  improper specimen collection/handling, submission of specimen other than nasopharyngeal swab, presence of viral mutation(s) within the areas targeted by this assay, and inadequate number of viral copies(<138 copies/mL). A negative result must be combined with clinical observations, patient history, and epidemiological information. The expected result is Negative.  Fact Sheet for Patients:  EntrepreneurPulse.com.au  Fact Sheet for Healthcare Providers:   IncredibleEmployment.be  This test is no t yet approved or cleared by the [place] FDA and  has been authorized for detection and/or diagnosis of SARS-CoV-2 by FDA under an Emergency Use Authorization (EUA). This EUA will remain  in effect (meaning this test can be used) for the duration of the COVID-19 declaration under Section 564(b)(1) of the Act, 21 U.S.C.section 360bbb-3(b)(1), unless the authorization is terminated  or revoked sooner.       Influenza A by PCR NEGATIVE NEGATIVE Final   Influenza B by PCR NEGATIVE NEGATIVE Final    Comment: (NOTE) The Xpert Xpress SARS-CoV-2/FLU/RSV plus assay is intended as an aid in the diagnosis of influenza from Nasopharyngeal swab specimens and should not be used as a sole basis for treatment. Nasal washings and aspirates are unacceptable for Xpert Xpress SARS-CoV-2/FLU/RSV testing.  Fact Sheet for Patients: EntrepreneurPulse.com.au  Fact Sheet for Healthcare Providers: IncredibleEmployment.be  This test is not yet approved or cleared by the [place] FDA and has been authorized for detection and/or diagnosis of SARS-CoV-2 by FDA under an Emergency Use Authorization (EUA). This EUA will remain in effect (meaning this test can be used) for the duration of the COVID-19 declaration under Section 564(b)(1) of the Act, 21 U.S.C. section 360bbb-3(b)(1), unless the authorization is terminated or revoked.  Performed at [facility], [place] [address] [place], [place] [address]   Urine Culture     Status: Abnormal   Collection Time: 10/07/21 10:36 AM   Specimen: Urine, Clean Catch  Result Value Ref Range Status   Specimen Description   Final    URINE, CLEAN CATCH Performed at [facility], [address], [place], [place] [address]    Special Requests   Final    NONE Performed at [facility], [address], [place], [place] [address]    Culture (A)  Final    <10,000  COLONIES/mL INSIGNIFICANT GROWTH Performed at [facility] [address] [place], [place] [address]    Report Status 10/08/2021 FINAL  Final    Please note: You were cared for by a hospitalist during your hospital stay. Once you are discharged, your primary care physician will handle any further medical issues. Please note that NO REFILLS for any  discharge medications will be authorized once you are discharged, as it is imperative that you return to your primary care physician (or establish a relationship with a primary care physician if you do not have one) for your post hospital discharge needs so that they can reassess your need for medications and monitor your lab values.    Time coordinating discharge: 40 minutes  SIGNED:   Shelly Coss, MD  [facility] 10/10/2021, 9:50 AM Pager [phone]  If 7PM-7AM, please contact night-coverage [email or web address] Password [email or web address]

## 2021-10-10 NOTE — Care Management Important Message (Signed)
Important Message  Patient Details  Name: Jaime Lin MRN: [id number] Date of Birth: 16-Apr-1940   Medicare Important Message Given:  N/A - LOS <3 / Initial given by admissions     Tommy Medal 10/10/2021, 10:15 AM

## 2021-10-10 NOTE — Progress Notes (Signed)
Patient slept on and off this shift no complaints of pain, Patient ambulated in the hallway with no issues.

## 2021-10-14 DIAGNOSIS — Z23 Encounter for immunization: Secondary | ICD-10-CM | POA: Diagnosis not present

## 2021-10-14 DIAGNOSIS — I10 Essential (primary) hypertension: Secondary | ICD-10-CM | POA: Diagnosis not present

## 2021-10-14 DIAGNOSIS — E871 Hypo-osmolality and hyponatremia: Secondary | ICD-10-CM | POA: Diagnosis not present

## 2021-10-20 ENCOUNTER — Other Ambulatory Visit (HOSPITAL_COMMUNITY): Payer: Self-pay | Admitting: Family Medicine

## 2021-10-20 DIAGNOSIS — Z1231 Encounter for screening mammogram for malignant neoplasm of breast: Secondary | ICD-10-CM

## 2021-10-27 ENCOUNTER — Ambulatory Visit (HOSPITAL_COMMUNITY): Payer: Medicare Other

## 2021-11-03 ENCOUNTER — Ambulatory Visit (HOSPITAL_COMMUNITY)
Admission: RE | Admit: 2021-11-03 | Discharge: 2021-11-03 | Disposition: A | Discharge status: Home / Self Care | Payer: Medicare Other | Source: Ambulatory Visit | Attending: Family Medicine | Admitting: Family Medicine

## 2021-11-03 DIAGNOSIS — Z1231 Encounter for screening mammogram for malignant neoplasm of breast: Secondary | ICD-10-CM | POA: Diagnosis not present

## 2021-11-06 ENCOUNTER — Other Ambulatory Visit (HOSPITAL_COMMUNITY): Payer: Self-pay | Admitting: Family Medicine

## 2021-11-10 ENCOUNTER — Other Ambulatory Visit (HOSPITAL_COMMUNITY): Payer: Self-pay | Admitting: Family Medicine

## 2021-11-10 DIAGNOSIS — R928 Other abnormal and inconclusive findings on diagnostic imaging of breast: Secondary | ICD-10-CM

## 2021-11-19 ENCOUNTER — Ambulatory Visit (HOSPITAL_COMMUNITY)
Admission: RE | Admit: 2021-11-19 | Discharge: 2021-11-19 | Disposition: A | Discharge status: Home / Self Care | Payer: Medicare Other | Source: Ambulatory Visit | Attending: Family Medicine | Admitting: Family Medicine

## 2021-11-19 DIAGNOSIS — N6489 Other specified disorders of breast: Secondary | ICD-10-CM | POA: Diagnosis not present

## 2021-11-19 DIAGNOSIS — R92321 Mammographic fibroglandular density, right breast: Secondary | ICD-10-CM | POA: Diagnosis not present

## 2021-11-19 DIAGNOSIS — R928 Other abnormal and inconclusive findings on diagnostic imaging of breast: Secondary | ICD-10-CM

## 2021-12-08 DIAGNOSIS — R7309 Other abnormal glucose: Secondary | ICD-10-CM | POA: Diagnosis not present

## 2021-12-08 DIAGNOSIS — E559 Vitamin D deficiency, unspecified: Secondary | ICD-10-CM | POA: Diagnosis not present

## 2021-12-08 DIAGNOSIS — E7800 Pure hypercholesterolemia, unspecified: Secondary | ICD-10-CM | POA: Diagnosis not present

## 2021-12-08 DIAGNOSIS — M899 Disorder of bone, unspecified: Secondary | ICD-10-CM | POA: Diagnosis not present

## 2021-12-08 DIAGNOSIS — Z1331 Encounter for screening for depression: Secondary | ICD-10-CM | POA: Diagnosis not present

## 2021-12-08 DIAGNOSIS — E871 Hypo-osmolality and hyponatremia: Secondary | ICD-10-CM | POA: Diagnosis not present

## 2021-12-08 DIAGNOSIS — Z0000 Encounter for general adult medical examination without abnormal findings: Secondary | ICD-10-CM | POA: Diagnosis not present

## 2021-12-08 DIAGNOSIS — K2100 Gastro-esophageal reflux disease with esophagitis, without bleeding: Secondary | ICD-10-CM | POA: Diagnosis not present

## 2021-12-08 DIAGNOSIS — I10 Essential (primary) hypertension: Secondary | ICD-10-CM | POA: Diagnosis not present

## 2021-12-08 DIAGNOSIS — N183 Chronic kidney disease, stage 3 unspecified: Secondary | ICD-10-CM | POA: Diagnosis not present

## 2021-12-18 DIAGNOSIS — Z961 Presence of intraocular lens: Secondary | ICD-10-CM | POA: Diagnosis not present

## 2021-12-25 DIAGNOSIS — E871 Hypo-osmolality and hyponatremia: Secondary | ICD-10-CM | POA: Diagnosis not present

## 2022-02-23 DIAGNOSIS — R42 Dizziness and giddiness: Secondary | ICD-10-CM | POA: Diagnosis not present

## 2022-02-23 DIAGNOSIS — I10 Essential (primary) hypertension: Secondary | ICD-10-CM | POA: Diagnosis not present

## 2022-02-24 DIAGNOSIS — Z23 Encounter for immunization: Secondary | ICD-10-CM | POA: Diagnosis not present

## 2022-03-03 IMAGING — MG MM DIGITAL SCREENING BILAT W/ TOMO AND CAD
8 series · 8 of 24 positions shown · non-contrast
Comparison: Previous exam(s).

CLINICAL DATA: Screening.

EXAM:
DIGITAL SCREENING BILATERAL MAMMOGRAM WITH TOMOSYNTHESIS AND CAD
TECHNIQUE: Bilateral screening digital craniocaudal and mediolateral oblique
mammograms were obtained. Bilateral screening digital breast
tomosynthesis was performed. The images were evaluated with
computer-aided detection.

[R MLO synth-2D]
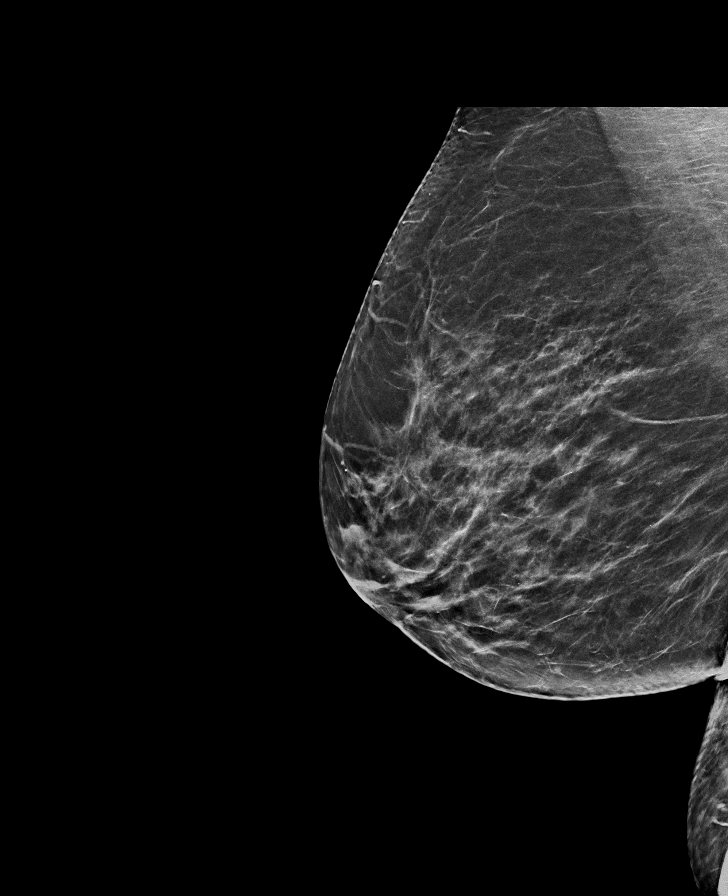

[L MLO synth-2D]
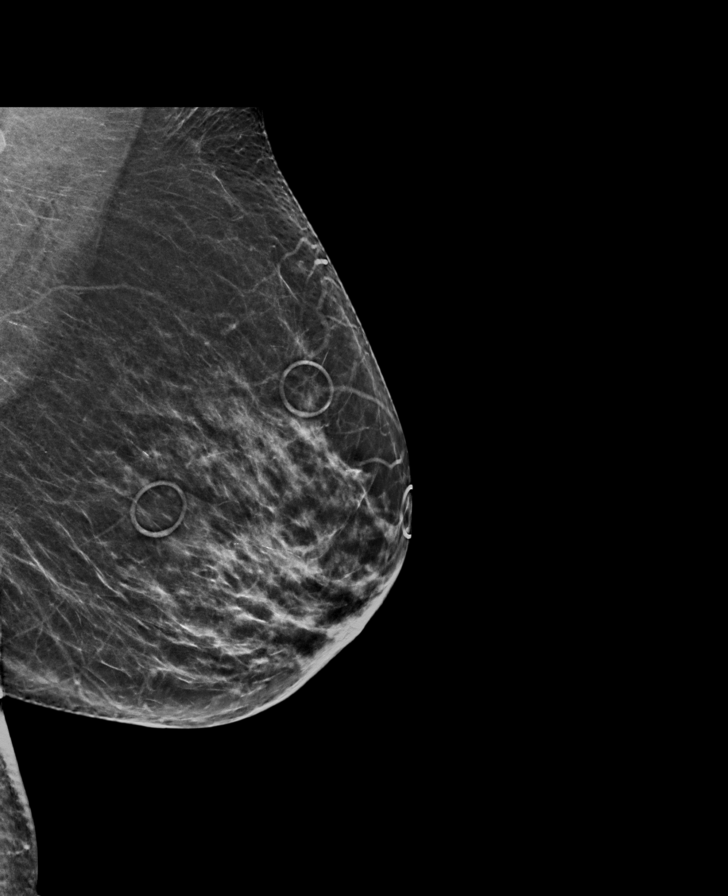

[R CC synth-2D]
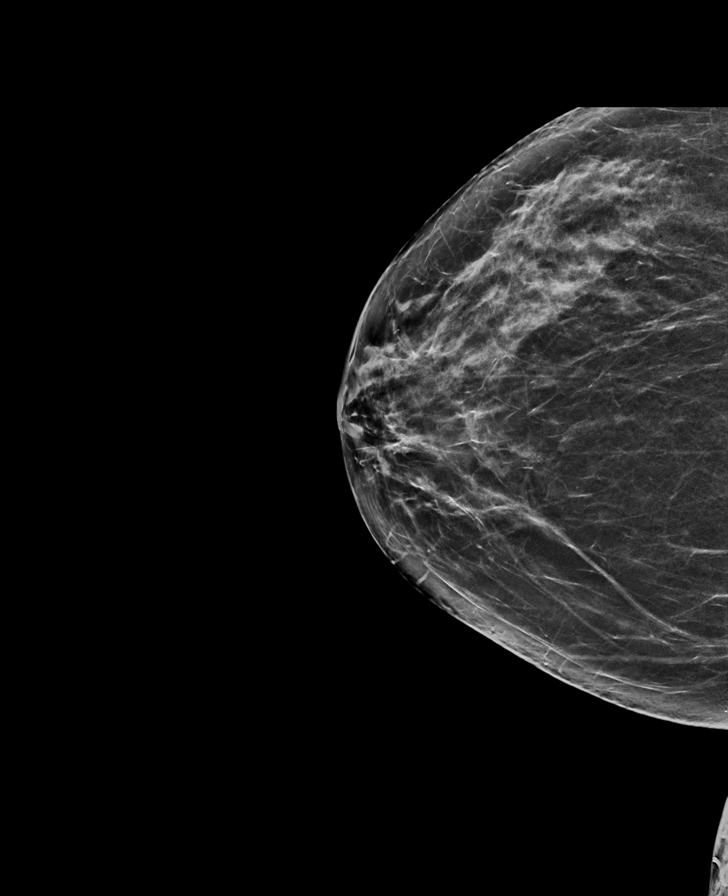

[L CC synth-2D]
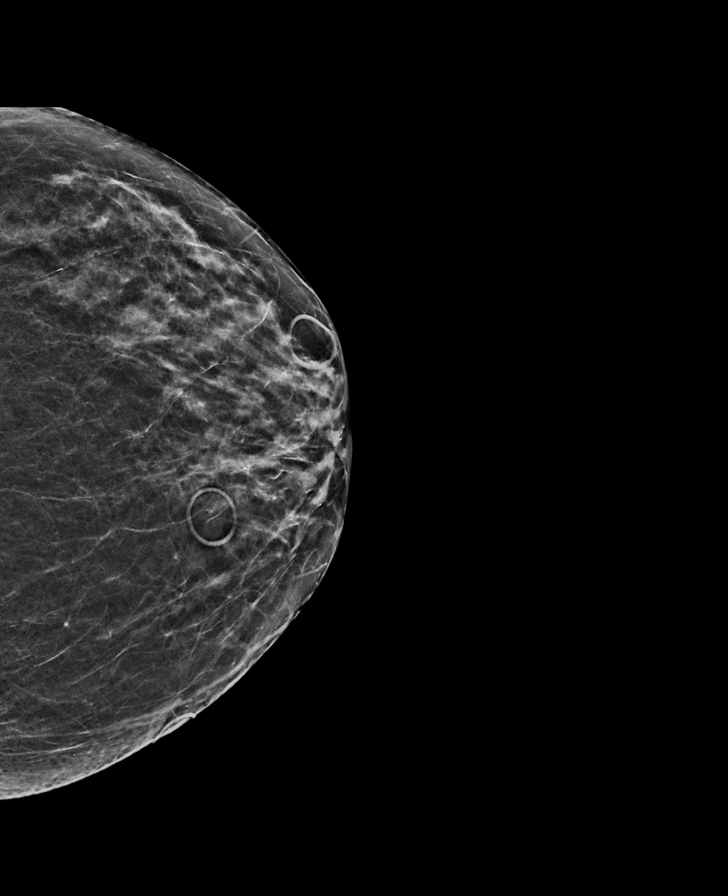

[R MLO tomo · tomo slice 33/65.0]
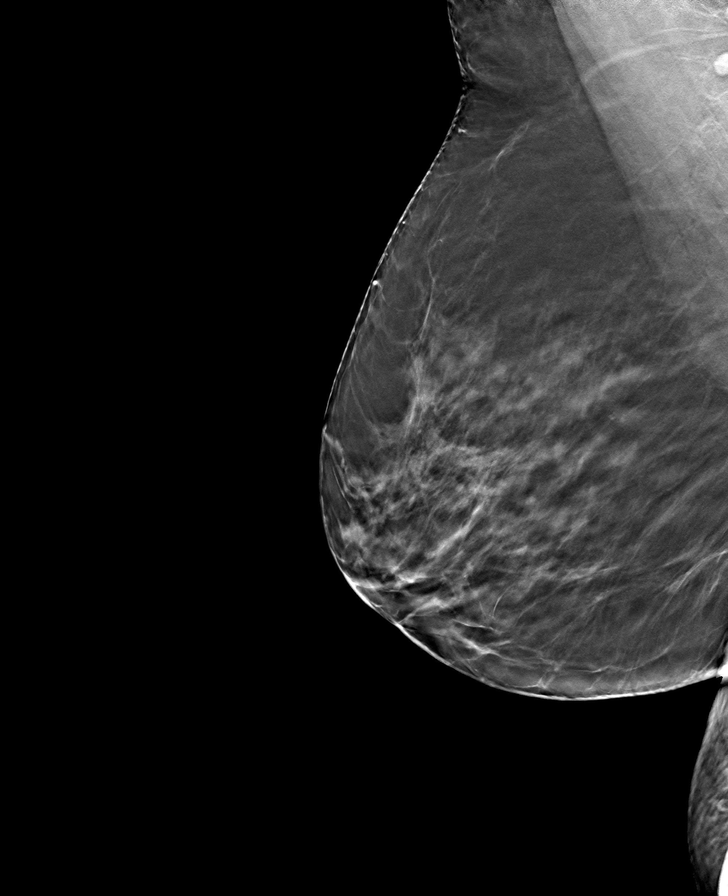

[L MLO tomo · tomo slice 33/65.0]
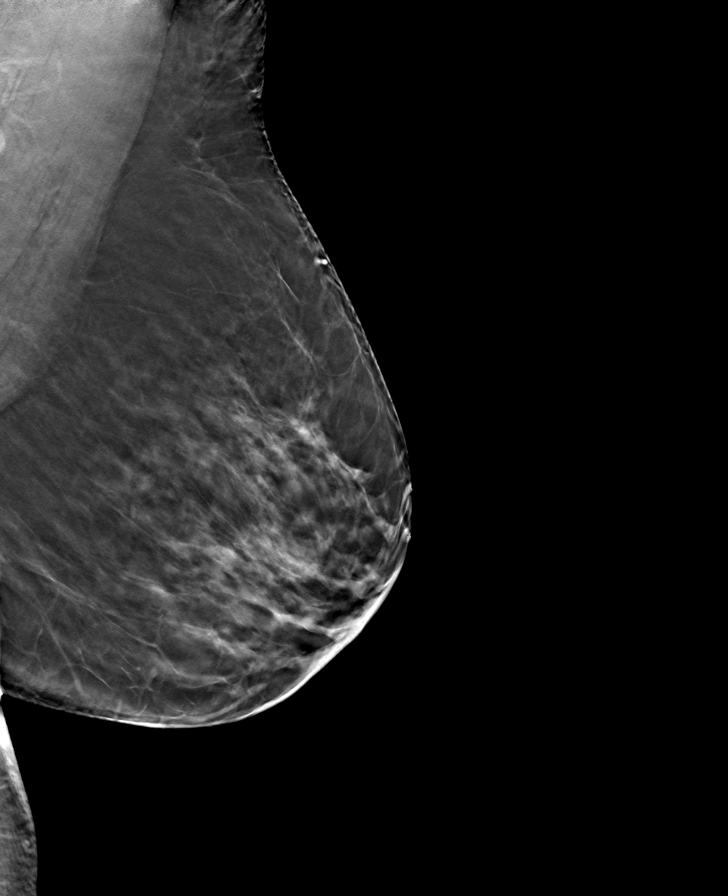

[R CC tomo · tomo slice 33/65.0]
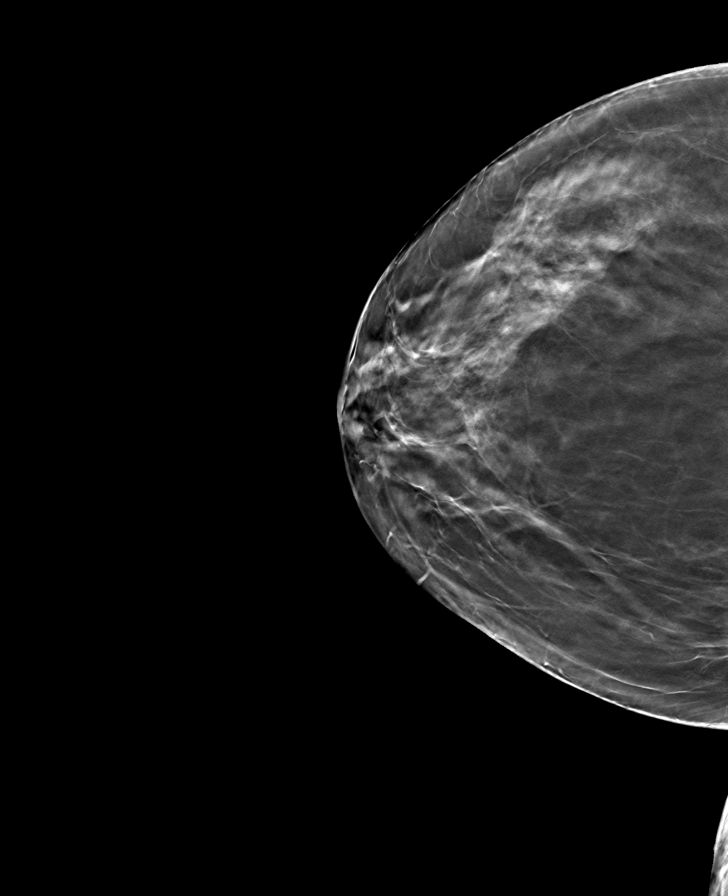

[L CC tomo · tomo slice 27/52.0]
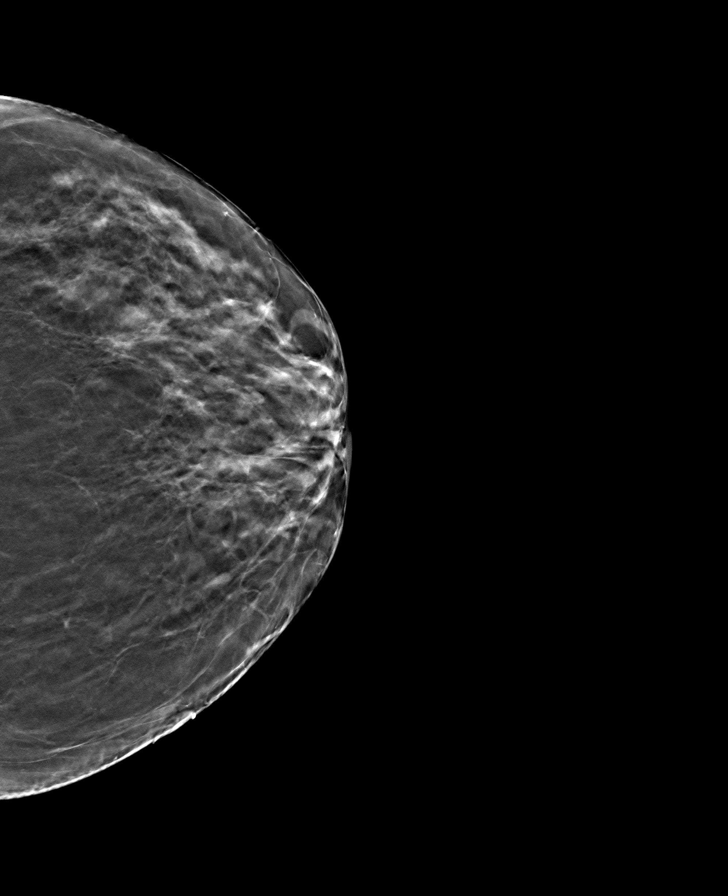

[8 of 24 positions shown; findings below may reference images not displayed]

ACR Breast Density Category c: The breast tissue is heterogeneously
dense, which may obscure small masses.
FINDINGS: There are no findings suspicious for malignancy. The images were
evaluated with computer-aided detection.
IMPRESSION: No mammographic evidence of malignancy. A result letter of this
screening mammogram will be mailed directly to the patient.

RECOMMENDATION:
Screening mammogram in one year. (Code:T4-5-GWO)

BI-RADS CATEGORY  1: Negative.

## 2022-03-04 ENCOUNTER — Encounter (HOSPITAL_COMMUNITY): Payer: Self-pay | Admitting: Emergency Medicine

## 2022-03-04 ENCOUNTER — Emergency Department (HOSPITAL_COMMUNITY)
Admission: EM | Admit: 2022-03-04 | Discharge: 2022-03-04 | Disposition: A | Discharge status: Home / Self Care | Payer: Medicare Other | Attending: Emergency Medicine | Admitting: Emergency Medicine

## 2022-03-04 ENCOUNTER — Other Ambulatory Visit: Payer: Self-pay

## 2022-03-04 DIAGNOSIS — I10 Essential (primary) hypertension: Secondary | ICD-10-CM | POA: Insufficient documentation

## 2022-03-04 DIAGNOSIS — Z79899 Other long term (current) drug therapy: Secondary | ICD-10-CM | POA: Diagnosis not present

## 2022-03-04 DIAGNOSIS — R42 Dizziness and giddiness: Secondary | ICD-10-CM | POA: Insufficient documentation

## 2022-03-04 DIAGNOSIS — Z7982 Long term (current) use of aspirin: Secondary | ICD-10-CM | POA: Insufficient documentation

## 2022-03-04 LAB — CBC
HCT: 36 % (ref 36.0–46.0)
Hemoglobin: 12.1 g/dL (ref 12.0–15.0)
MCH: 30 pg (ref 26.0–34.0)
MCHC: 33.6 g/dL (ref 30.0–36.0)
MCV: 89.3 fL (ref 80.0–100.0)
Platelets: 224 10*3/uL (ref 150–400)
RBC: 4.03 MIL/uL (ref 3.87–5.11)
RDW: 12.3 % (ref 11.5–15.5)
WBC: 5 10*3/uL (ref 4.0–10.5)
nRBC: 0 % (ref 0.0–0.2)

## 2022-03-04 LAB — BASIC METABOLIC PANEL
Anion gap: 9 (ref 5–15)
BUN: 18 mg/dL (ref 8–23)
CO2: 26 mmol/L (ref 22–32)
Calcium: 9.2 mg/dL (ref 8.9–10.3)
Chloride: 99 mmol/L (ref 98–111)
Creatinine, Ser: 1.01 mg/dL — ABNORMAL HIGH (ref 0.44–1.00)
GFR, Estimated: 56 mL/min — ABNORMAL LOW (ref >60)
Glucose, Bld: 113 mg/dL — ABNORMAL HIGH (ref 70–99)
Potassium: 4.3 mmol/L (ref 3.5–5.1)
Sodium: 134 mmol/L — ABNORMAL LOW (ref 135–145)

## 2022-03-04 NOTE — Discharge Instructions (Signed)
Increase your lisinopril so you are taking 20 mg a day or 2 tablets of 10 mg each.  Get your blood pressure checked Monday at your doctor's office as planned

## 2022-03-04 NOTE — ED Triage Notes (Signed)
Pt states she woke up this morning and checked her BP. States it was over [id number] systolic. Patient started taking lisinopril last week. States she took one last night and another at 0730 this morning with no change. Bp is 196/97 in triage.

## 2022-03-04 NOTE — ED Provider Notes (Signed)
[facility] Provider Note   CSN: [id number] Arrival date & time: 03/04/22  [id number]     History  Chief Complaint  Patient presents with   Hypertension    Jaime Lin is a 82 y.o. female.  Patient states that she was having some dizziness and checked her blood pressure and it was elevated.  She has a history of hypertension.  The history is provided by the patient and medical records. No language interpreter was used.  Hypertension This is a recurrent problem. The current episode started more than 1 week ago. The problem occurs constantly. The problem has not changed since onset.Pertinent negatives include no chest pain, no abdominal pain and no headaches. Nothing aggravates the symptoms. Nothing relieves the symptoms. She has tried nothing for the symptoms.       Home Medications Prior to Admission medications   Medication Sig Start Date End Date Taking? Authorizing Provider  aspirin EC 81 MG tablet Take 162 mg by mouth daily. Swallow whole.   Yes [provider]  Calcium Carbonate-Vitamin D3 (CALCIUM 600-D) 600-400 MG-UNIT TABS Take 1 tablet by mouth daily.   Yes [provider]  Cholecalciferol (VITAMIN D) 50 MCG (2000 UT) CAPS Take 1 capsule by mouth daily.   Yes [provider]  lisinopril (ZESTRIL) 10 MG tablet Take 10 mg by mouth daily. 02/23/22  Yes [provider]  Multiple Vitamin (MULTIVITAMIN WITH MINERALS) TABS tablet Take 1 tablet by mouth daily.   Yes [provider]  pantoprazole (PROTONIX) 20 MG tablet Take 20 mg by mouth daily. 08/21/21  Yes [provider]      Allergies    Omeprazole, Voltaren [diclofenac sodium], and Diclofenac sodium    Review of Systems   Review of Systems  Constitutional:  Negative for appetite change and fatigue.  HENT:  Negative for congestion, ear discharge and sinus pressure.   Eyes:  Negative for discharge.  Respiratory:  Negative for  cough.   Cardiovascular:  Negative for chest pain.  Gastrointestinal:  Negative for abdominal pain and diarrhea.  Genitourinary:  Negative for frequency and hematuria.  Musculoskeletal:  Negative for back pain.  Skin:  Negative for rash.  Neurological:  Positive for dizziness. Negative for seizures and headaches.  Psychiatric/Behavioral:  Negative for hallucinations.     Physical Exam Updated Vital Signs BP (!) 161/84   Pulse (!) 53   Temp (!) 97.5 F (36.4 C) (Oral)   Resp 15   Ht '5\' 6"'$  (1.676 m)   Wt 79.4 kg   SpO2 100%   BMI 28.25 kg/m  Physical Exam Vitals and nursing note reviewed.  Constitutional:      Appearance: She is well-developed.  HENT:     Head: Normocephalic.     Nose: Nose normal.  Eyes:     General: No scleral icterus.    Conjunctiva/sclera: Conjunctivae normal.  Neck:     Thyroid: No thyromegaly.  Cardiovascular:     Rate and Rhythm: Normal rate and regular rhythm.     Heart sounds: No murmur heard.    No friction rub. No gallop.  Pulmonary:     Breath sounds: No stridor. No wheezing or rales.  Chest:     Chest wall: No tenderness.  Abdominal:     General: There is no distension.     Tenderness: There is no abdominal tenderness. There is no rebound.  Musculoskeletal:        General: Normal range  of motion.     Cervical back: Neck supple.  Lymphadenopathy:     Cervical: No cervical adenopathy.  Skin:    Findings: No erythema or rash.  Neurological:     Mental Status: She is alert and oriented to person, place, and time.     Motor: No abnormal muscle tone.     Coordination: Coordination normal.  Psychiatric:        Behavior: Behavior normal.     ED Results / Procedures / Treatments   Labs (all labs ordered are listed, but only abnormal results are displayed) Labs Reviewed  BASIC METABOLIC PANEL - Abnormal; Notable for the following components:      Result Value   Sodium 134 (*)    Glucose, Bld 113 (*)    Creatinine, Ser 1.01 (*)     GFR, Estimated 56 (*)    All other components within normal limits  CBC  CBC WITH DIFFERENTIAL/PLATELET    EKG None  Radiology No results found.  Procedures Procedures    Medications Ordered in ED Medications - No data to display  ED Course/ Medical Decision Making/ A&P                             Medical Decision Making Amount and/or Complexity of Data Reviewed Labs: ordered. ECG/medicine tests: ordered.  This patient presents to the ED for concern of dizziness, this involves an extensive number of treatment options, and is a complaint that carries with it a high risk of complications and morbidity.  The differential diagnosis includes stroke, poorly controlled hypertension   Co morbidities that complicate the patient evaluation  Hypertension   Additional history obtained:  Additional history obtained from patient External records from outside source obtained and reviewed including hospital records   Lab Tests:  I Ordered, and personally interpreted labs.  The pertinent results include: CBC and chemistries unremarkable   Imaging Studies ordered:  No imaging  Cardiac Monitoring: / EKG:  The patient was maintained on a cardiac monitor.  I personally viewed and interpreted the cardiac monitored which showed an underlying rhythm of: Normal sinus rhythm   Consultations Obtained:  No consultant  Problem List / ED Course / Critical interventions / Medication management  Hypertension and dizziness No medicines ordered Reevaluation of the patient after these medicines showed that the patient improved I have reviewed the patients home medicines and have made adjustments as needed   Social Determinants of Health:  None   Test / Admission - Considered:  None  Patient with hypertension.  We will increase her lisinopril so she is taking 20 mg a day and she will follow-up with her doctor Monday        Final Clinical Impression(s) / ED  Diagnoses Final diagnoses:  Hypertension, unspecified type    Rx / DC Orders ED Discharge Orders     None         Milton Ferguson, MD 03/06/22 [phone]

## 2022-03-09 DIAGNOSIS — K2100 Gastro-esophageal reflux disease with esophagitis, without bleeding: Secondary | ICD-10-CM | POA: Diagnosis not present

## 2022-03-09 DIAGNOSIS — E871 Hypo-osmolality and hyponatremia: Secondary | ICD-10-CM | POA: Diagnosis not present

## 2022-03-09 DIAGNOSIS — E7800 Pure hypercholesterolemia, unspecified: Secondary | ICD-10-CM | POA: Diagnosis not present

## 2022-03-09 DIAGNOSIS — I10 Essential (primary) hypertension: Secondary | ICD-10-CM | POA: Diagnosis not present

## 2022-04-07 DIAGNOSIS — K2100 Gastro-esophageal reflux disease with esophagitis, without bleeding: Secondary | ICD-10-CM | POA: Diagnosis not present

## 2022-04-07 DIAGNOSIS — I10 Essential (primary) hypertension: Secondary | ICD-10-CM | POA: Diagnosis not present

## 2022-06-23 DIAGNOSIS — H6122 Impacted cerumen, left ear: Secondary | ICD-10-CM | POA: Diagnosis not present

## 2022-09-03 DIAGNOSIS — L821 Other seborrheic keratosis: Secondary | ICD-10-CM | POA: Diagnosis not present

## 2022-09-03 DIAGNOSIS — D1801 Hemangioma of skin and subcutaneous tissue: Secondary | ICD-10-CM | POA: Diagnosis not present

## 2022-09-03 DIAGNOSIS — L820 Inflamed seborrheic keratosis: Secondary | ICD-10-CM | POA: Diagnosis not present

## 2022-10-03 ENCOUNTER — Ambulatory Visit
Admission: EM | Admit: 2022-10-03 | Discharge: 2022-10-03 | Disposition: A | Discharge status: Home / Self Care | Payer: Medicare Other | Attending: Family Medicine | Admitting: Family Medicine

## 2022-10-03 DIAGNOSIS — J069 Acute upper respiratory infection, unspecified: Secondary | ICD-10-CM | POA: Diagnosis not present

## 2022-10-03 DIAGNOSIS — R059 Cough, unspecified: Secondary | ICD-10-CM | POA: Insufficient documentation

## 2022-10-03 DIAGNOSIS — Z1152 Encounter for screening for COVID-19: Secondary | ICD-10-CM | POA: Insufficient documentation

## 2022-10-03 DIAGNOSIS — B9789 Other viral agents as the cause of diseases classified elsewhere: Secondary | ICD-10-CM | POA: Diagnosis not present

## 2022-10-03 MED ORDER — PROMETHAZINE-DM 6.25-15 MG/5ML PO SYRP: 5.0000 mL | ORAL_SOLUTION | Freq: Four times a day (QID) | ORAL | 0 refills | Status: DC | PRN

## 2022-10-03 NOTE — ED Provider Notes (Signed)
[facility]    CSN: [id number] Arrival date & time: 10/03/22  0808      History   Chief Complaint No chief complaint on file.   HPI Jaime Lin is a 82 y.o. female.   Patient presenting today with mild cough for the past few days and then yesterday started with worsening cough, sore throat, postnasal drainage, fatigue.  Denies known fever, chest pain, shortness of breath, abdominal pain, nausea vomiting or diarrhea.  Multiple sick contacts recently.  Took Mucinex at 4:30 AM this morning and took a Tylenol PM last night.  No known history of chronic pulmonary disease.    Past Medical History:  Diagnosis Date   Acid reflux    PUD (peptic ulcer disease)     Patient Active Problem List   Diagnosis Date Noted   Hyponatremia 10/08/2021   HTN (hypertension) 10/08/2021   PUD (peptic ulcer disease) 10/08/2021    Past Surgical History:  Procedure Laterality Date   ABDOMINAL HYSTERECTOMY     APPENDECTOMY     CHOLECYSTECTOMY     COLONOSCOPY     COLONOSCOPY N/A 09/05/2015   Procedure: COLONOSCOPY;  Surgeon: Malissa Hippo, MD;  Location: [facility];  Service: Endoscopy;  Laterality: N/A;  925    OB History   No obstetric history on file.      Home Medications    Prior to Admission medications   Medication Sig Start Date End Date Taking? Authorizing Provider  promethazine-dextromethorphan (PROMETHAZINE-DM) 6.25-15 MG/5ML syrup Take 5 mLs by mouth 4 (four) times daily as needed. 10/03/22  Yes Particia Nearing, PA-C  aspirin EC 81 MG tablet Take 162 mg by mouth daily. Swallow whole.    [provider]  Calcium Carbonate-Vitamin D3 (CALCIUM 600-D) 600-400 MG-UNIT TABS Take 1 tablet by mouth daily.    [provider]  Cholecalciferol (VITAMIN D) 50 MCG (2000 UT) CAPS Take 1 capsule by mouth daily.    [provider]  lisinopril (ZESTRIL) 10 MG tablet Take 10 mg by mouth daily. 02/23/22   [provider]  Multiple Vitamin  (MULTIVITAMIN WITH MINERALS) TABS tablet Take 1 tablet by mouth daily.    [provider]  pantoprazole (PROTONIX) 20 MG tablet Take 20 mg by mouth daily. 08/21/21   [provider]    Family History Family History  Problem Relation Age of Onset   Cirrhosis Mother    Hypertension Father    Stomach cancer Father    Coronary artery disease Sister    Prostate cancer Brother    Sleep apnea Brother    Breast cancer Sister     Social History Social History   Tobacco Use   Smoking status: Never   Smokeless tobacco: Never  Substance Use Topics   Alcohol use: No   Drug use: No     Allergies   Omeprazole, Voltaren [diclofenac sodium], and Diclofenac sodium   Review of Systems Review of Systems Per HPI  Physical Exam Triage Vital Signs ED Triage Vitals  Encounter Vitals Group     BP 10/03/22 0819 (!) 172/97     Systolic BP Percentile --      Diastolic BP Percentile --      Pulse Rate 10/03/22 0819 68     Resp 10/03/22 0819 18     Temp 10/03/22 0819 98.3 F (36.8 C)     Temp Source 10/03/22 0819 Oral     SpO2 10/03/22 0819 96 %  Weight --      Height --      Head Circumference --      Peak Flow --      Pain Score 10/03/22 0821 0     Pain Loc --      Pain Education --      Exclude from Growth Chart --    No data found.  Updated Vital Signs BP (!) 172/97 (BP Location: Right Arm) Comment: took mucinex about 4 am  Pulse 68   Temp 98.3 F (36.8 C) (Oral)   Resp 18   SpO2 96%   Visual Acuity Right Eye Distance:   Left Eye Distance:   Bilateral Distance:    Right Eye Near:   Left Eye Near:    Bilateral Near:     Physical Exam Vitals and nursing note reviewed.  Constitutional:      Appearance: Normal appearance. She is not ill-appearing.  HENT:     Head: Atraumatic.     Right Ear: Tympanic membrane and external ear normal.     Left Ear: Tympanic membrane and external ear normal.     Nose: Rhinorrhea present.     Mouth/Throat:      Mouth: Mucous membranes are moist.     Pharynx: Posterior oropharyngeal erythema present.  Eyes:     Extraocular Movements: Extraocular movements intact.     Conjunctiva/sclera: Conjunctivae normal.  Cardiovascular:     Rate and Rhythm: Normal rate and regular rhythm.     Heart sounds: Normal heart sounds.  Pulmonary:     Effort: Pulmonary effort is normal.     Breath sounds: Normal breath sounds. No wheezing or rales.  Musculoskeletal:        General: Normal range of motion.     Cervical back: Normal range of motion and neck supple.  Skin:    General: Skin is warm and dry.  Neurological:     Mental Status: She is alert and oriented to person, place, and time.  Psychiatric:        Mood and Affect: Mood normal.        Thought Content: Thought content normal.        Judgment: Judgment normal.      UC Treatments / Results  Labs (all labs ordered are listed, but only abnormal results are displayed) Labs Reviewed  SARS CORONAVIRUS 2 (TAT 6-24 HRS)    EKG   Radiology No results found.  Procedures Procedures (including critical care time)  Medications Ordered in UC Medications - No data to display  Initial Impression / Assessment and Plan / UC Course  I have reviewed the triage vital signs and the nursing notes.  Pertinent labs & imaging results that were available during my care of the patient were reviewed by me and considered in my medical decision making (see chart for details).     Hypertensive in triage, otherwise vital signs within normal limits.  Suspicious for viral respiratory infection.  COVID testing pending, good candidate for Paxlovid if positive.  Treat with Phenergan DM, supportive over-the-counter medications and home care.  Return precautions reviewed.  Final Clinical Impressions(s) / UC Diagnoses   Final diagnoses:  Viral URI with cough     Discharge Instructions      You may use Coricidin HBP, Flonase nasal spray twice daily, plain Mucinex,  humidifiers, sinus rinses. Your COVID results should be back tomorrow.    ED Prescriptions     Medication Sig Dispense Auth. Provider  promethazine-dextromethorphan (PROMETHAZINE-DM) 6.25-15 MG/5ML syrup Take 5 mLs by mouth 4 (four) times daily as needed. 100 mL Particia Nearing, [place]      PDMP not reviewed this encounter.   Roosvelt Maser [place], [place] 10/03/22 [phone]

## 2022-10-03 NOTE — ED Triage Notes (Signed)
Pt reports she has had coughing x 1 week. States it got worse last night with throat pain.

## 2022-10-03 NOTE — Discharge Instructions (Signed)
You may use Coricidin HBP, Flonase nasal spray twice daily, plain Mucinex, humidifiers, sinus rinses. Your COVID results should be back tomorrow.

## 2022-10-04 LAB — SARS CORONAVIRUS 2 (TAT 6-24 HRS): SARS Coronavirus 2: NEGATIVE

## 2022-10-13 DIAGNOSIS — J988 Other specified respiratory disorders: Secondary | ICD-10-CM | POA: Diagnosis not present

## 2022-11-20 DIAGNOSIS — Z23 Encounter for immunization: Secondary | ICD-10-CM | POA: Diagnosis not present

## 2022-11-24 DIAGNOSIS — H524 Presbyopia: Secondary | ICD-10-CM | POA: Diagnosis not present

## 2022-11-24 DIAGNOSIS — Z961 Presence of intraocular lens: Secondary | ICD-10-CM | POA: Diagnosis not present

## 2022-11-24 DIAGNOSIS — H26493 Other secondary cataract, bilateral: Secondary | ICD-10-CM | POA: Diagnosis not present

## 2022-11-25 DIAGNOSIS — H26492 Other secondary cataract, left eye: Secondary | ICD-10-CM | POA: Diagnosis not present

## 2023-02-09 DIAGNOSIS — I10 Essential (primary) hypertension: Secondary | ICD-10-CM | POA: Diagnosis not present

## 2023-02-09 DIAGNOSIS — R7309 Other abnormal glucose: Secondary | ICD-10-CM | POA: Diagnosis not present

## 2023-02-09 DIAGNOSIS — E559 Vitamin D deficiency, unspecified: Secondary | ICD-10-CM | POA: Diagnosis not present

## 2023-02-09 DIAGNOSIS — E7800 Pure hypercholesterolemia, unspecified: Secondary | ICD-10-CM | POA: Diagnosis not present

## 2023-02-11 ENCOUNTER — Other Ambulatory Visit (HOSPITAL_COMMUNITY): Payer: Self-pay | Admitting: Family Medicine

## 2023-02-11 DIAGNOSIS — Z8541 Personal history of malignant neoplasm of cervix uteri: Secondary | ICD-10-CM | POA: Diagnosis not present

## 2023-02-11 DIAGNOSIS — M8580 Other specified disorders of bone density and structure, unspecified site: Secondary | ICD-10-CM

## 2023-02-11 DIAGNOSIS — E7800 Pure hypercholesterolemia, unspecified: Secondary | ICD-10-CM | POA: Diagnosis not present

## 2023-02-11 DIAGNOSIS — I10 Essential (primary) hypertension: Secondary | ICD-10-CM | POA: Diagnosis not present

## 2023-02-11 DIAGNOSIS — R7303 Prediabetes: Secondary | ICD-10-CM | POA: Diagnosis not present

## 2023-02-11 DIAGNOSIS — K2100 Gastro-esophageal reflux disease with esophagitis, without bleeding: Secondary | ICD-10-CM | POA: Diagnosis not present

## 2023-02-11 DIAGNOSIS — Z23 Encounter for immunization: Secondary | ICD-10-CM | POA: Diagnosis not present

## 2023-02-11 DIAGNOSIS — M899 Disorder of bone, unspecified: Secondary | ICD-10-CM | POA: Diagnosis not present

## 2023-02-11 DIAGNOSIS — N1831 Chronic kidney disease, stage 3a: Secondary | ICD-10-CM | POA: Diagnosis not present

## 2023-02-11 DIAGNOSIS — R7309 Other abnormal glucose: Secondary | ICD-10-CM | POA: Diagnosis not present

## 2023-02-11 DIAGNOSIS — E559 Vitamin D deficiency, unspecified: Secondary | ICD-10-CM | POA: Diagnosis not present

## 2023-02-11 DIAGNOSIS — Z0000 Encounter for general adult medical examination without abnormal findings: Secondary | ICD-10-CM | POA: Diagnosis not present

## 2023-05-11 DIAGNOSIS — E871 Hypo-osmolality and hyponatremia: Secondary | ICD-10-CM | POA: Diagnosis not present

## 2023-05-11 DIAGNOSIS — R42 Dizziness and giddiness: Secondary | ICD-10-CM | POA: Diagnosis not present

## 2023-05-13 ENCOUNTER — Other Ambulatory Visit: Payer: Self-pay | Admitting: Family Medicine

## 2023-05-13 DIAGNOSIS — R42 Dizziness and giddiness: Secondary | ICD-10-CM

## 2023-05-19 ENCOUNTER — Encounter: Payer: Self-pay | Admitting: Family Medicine

## 2023-06-14 ENCOUNTER — Other Ambulatory Visit (HOSPITAL_COMMUNITY): Payer: Self-pay | Admitting: Family Medicine

## 2023-06-14 DIAGNOSIS — Z1231 Encounter for screening mammogram for malignant neoplasm of breast: Secondary | ICD-10-CM

## 2023-06-14 DIAGNOSIS — M8588 Other specified disorders of bone density and structure, other site: Secondary | ICD-10-CM

## 2023-06-19 ENCOUNTER — Ambulatory Visit
Admission: RE | Admit: 2023-06-19 | Discharge: 2023-06-19 | Disposition: A | Discharge status: Home / Self Care | Source: Ambulatory Visit | Attending: Family Medicine | Admitting: Family Medicine

## 2023-06-19 DIAGNOSIS — R42 Dizziness and giddiness: Secondary | ICD-10-CM | POA: Diagnosis not present

## 2023-06-19 DIAGNOSIS — R9082 White matter disease, unspecified: Secondary | ICD-10-CM | POA: Diagnosis not present

## 2023-06-19 MED ORDER — GADOPICLENOL 0.5 MMOL/ML IV SOLN
8.0000 mL | Freq: Once | INTRAVENOUS | Status: AC | PRN
  Administered 2023-06-19: 8 mL via INTRAVENOUS

## 2023-06-23 ENCOUNTER — Ambulatory Visit (HOSPITAL_COMMUNITY)
Admission: RE | Admit: 2023-06-23 | Discharge: 2023-06-23 | Disposition: A | Discharge status: Home / Self Care | Source: Ambulatory Visit | Attending: Family Medicine | Admitting: Family Medicine

## 2023-06-23 ENCOUNTER — Encounter (HOSPITAL_COMMUNITY): Payer: Self-pay

## 2023-06-23 DIAGNOSIS — Z1231 Encounter for screening mammogram for malignant neoplasm of breast: Secondary | ICD-10-CM | POA: Diagnosis not present

## 2023-06-23 DIAGNOSIS — M8588 Other specified disorders of bone density and structure, other site: Secondary | ICD-10-CM | POA: Insufficient documentation

## 2023-06-23 DIAGNOSIS — Z780 Asymptomatic menopausal state: Secondary | ICD-10-CM | POA: Diagnosis not present

## 2023-06-23 DIAGNOSIS — M810 Age-related osteoporosis without current pathological fracture: Secondary | ICD-10-CM | POA: Diagnosis not present

## 2023-08-05 DIAGNOSIS — L820 Inflamed seborrheic keratosis: Secondary | ICD-10-CM | POA: Diagnosis not present

## 2023-08-05 DIAGNOSIS — D1801 Hemangioma of skin and subcutaneous tissue: Secondary | ICD-10-CM | POA: Diagnosis not present

## 2023-08-05 DIAGNOSIS — L821 Other seborrheic keratosis: Secondary | ICD-10-CM | POA: Diagnosis not present

## 2023-08-12 DIAGNOSIS — K2100 Gastro-esophageal reflux disease with esophagitis, without bleeding: Secondary | ICD-10-CM | POA: Diagnosis not present

## 2023-08-12 DIAGNOSIS — R7303 Prediabetes: Secondary | ICD-10-CM | POA: Diagnosis not present

## 2023-08-12 DIAGNOSIS — R5383 Other fatigue: Secondary | ICD-10-CM | POA: Diagnosis not present

## 2023-08-12 DIAGNOSIS — E559 Vitamin D deficiency, unspecified: Secondary | ICD-10-CM | POA: Diagnosis not present

## 2023-08-12 DIAGNOSIS — R197 Diarrhea, unspecified: Secondary | ICD-10-CM | POA: Diagnosis not present

## 2023-08-12 DIAGNOSIS — E7800 Pure hypercholesterolemia, unspecified: Secondary | ICD-10-CM | POA: Diagnosis not present

## 2023-08-12 DIAGNOSIS — N1831 Chronic kidney disease, stage 3a: Secondary | ICD-10-CM | POA: Diagnosis not present

## 2023-08-12 DIAGNOSIS — I10 Essential (primary) hypertension: Secondary | ICD-10-CM | POA: Diagnosis not present

## 2023-08-29 ENCOUNTER — Other Ambulatory Visit: Payer: Self-pay

## 2023-08-29 ENCOUNTER — Emergency Department (HOSPITAL_COMMUNITY)
Admission: EM | Admit: 2023-08-29 | Discharge: 2023-08-30 | Disposition: A | Discharge status: Home / Self Care | Attending: Emergency Medicine | Admitting: Emergency Medicine

## 2023-08-29 ENCOUNTER — Encounter (HOSPITAL_COMMUNITY): Payer: Self-pay | Admitting: Emergency Medicine

## 2023-08-29 DIAGNOSIS — I10 Essential (primary) hypertension: Secondary | ICD-10-CM | POA: Insufficient documentation

## 2023-08-29 DIAGNOSIS — Z79899 Other long term (current) drug therapy: Secondary | ICD-10-CM | POA: Diagnosis not present

## 2023-08-29 DIAGNOSIS — R42 Dizziness and giddiness: Secondary | ICD-10-CM | POA: Insufficient documentation

## 2023-08-29 DIAGNOSIS — I160 Hypertensive urgency: Secondary | ICD-10-CM

## 2023-08-29 DIAGNOSIS — Z7982 Long term (current) use of aspirin: Secondary | ICD-10-CM | POA: Diagnosis not present

## 2023-08-29 HISTORY — DX: Essential (primary) hypertension: I10

## 2023-08-29 MED ORDER — CLONIDINE HCL 0.2 MG PO TABS
0.2000 mg | ORAL_TABLET | Freq: Once | ORAL | Status: AC
  Administered 2023-08-29: 0.2 mg via ORAL
  Filled 2023-08-29: qty 1

## 2023-08-29 NOTE — ED Provider Notes (Signed)
 [place] EMERGENCY DEPARTMENT AT [facility] Provider Note   CSN: [id number] Arrival date & time: 08/29/23  2318     Patient presents with: Hypertension   Jaime Lin is a 83 y.o. female.  {Add pertinent medical, surgical, social history, OB history to HPI:32947} Patient is an 83 year old female with past medical history of hypertension, hyponatremia, GERD.  Patient presenting today for evaluation of elevated blood pressure.  She reports not feeling well for the past several days.  Yesterday evening she checked her blood pressure and it was over 200 systolic.  She took an extra dose of lisinopril  and it seemed to improve.  Her blood pressure spiked again this evening greater than 200, so presents for evaluation of this.  She denies to me she is experiencing any headache.  No weakness or numbness.  No visual disturbances.  She denies any chest pain or difficulty breathing.  Her only complaints are feeling somewhat dizzy.       Prior to Admission medications   Medication Sig Start Date End Date Taking? Authorizing Provider  aspirin EC 81 MG tablet Take 162 mg by mouth daily. Swallow whole.    [provider]  Calcium Carbonate-Vitamin D3 (CALCIUM 600-D) 600-400 MG-UNIT TABS Take 1 tablet by mouth daily.    [provider]  Cholecalciferol (VITAMIN D) 50 MCG (2000 UT) CAPS Take 1 capsule by mouth daily.    [provider]  lisinopril  (ZESTRIL ) 10 MG tablet Take 10 mg by mouth daily. 02/23/22   [provider]  Multiple Vitamin (MULTIVITAMIN WITH MINERALS) TABS tablet Take 1 tablet by mouth daily.    [provider]  pantoprazole  (PROTONIX ) 20 MG tablet Take 20 mg by mouth daily. 08/21/21   [provider]  promethazine -dextromethorphan (PROMETHAZINE -DM) 6.25-15 MG/5ML syrup Take 5 mLs by mouth 4 (four) times daily as needed. 10/03/22   Stuart Vernell Norris, PA-C    Allergies: Omeprazole, Voltaren [diclofenac sodium], and  Diclofenac sodium    Review of Systems  All other systems reviewed and are negative.   Updated Vital Signs BP (!) 220/84   Pulse 62   Temp 97.9 F (36.6 C) (Oral)   Resp 17   Ht 5' 6 (1.676 m)   Wt 77.1 kg   SpO2 97%   BMI 27.44 kg/m   Physical Exam Vitals and nursing note reviewed.  Constitutional:      General: She is not in acute distress.    Appearance: She is well-developed. She is not diaphoretic.  HENT:     Head: Normocephalic and atraumatic.  Eyes:     Extraocular Movements: Extraocular movements intact.     Pupils: Pupils are equal, round, and reactive to light.  Cardiovascular:     Rate and Rhythm: Normal rate and regular rhythm.     Heart sounds: No murmur heard.    No friction rub. No gallop.  Pulmonary:     Effort: Pulmonary effort is normal. No respiratory distress.     Breath sounds: Normal breath sounds. No wheezing.  Abdominal:     General: Bowel sounds are normal. There is no distension.     Palpations: Abdomen is soft.     Tenderness: There is no abdominal tenderness.  Musculoskeletal:        General: Normal range of motion.     Cervical back: Normal range of motion and neck supple.  Skin:    General: Skin is warm and dry.  Neurological:     General:  No focal deficit present.     Mental Status: She is alert and oriented to person, place, and time.     Cranial Nerves: No cranial nerve deficit.     Motor: No weakness.     (all labs ordered are listed, but only abnormal results are displayed) Labs Reviewed - No data to display  EKG: EKG Interpretation Date/Time:  Sunday August 29 2023 23:32:03 EDT Ventricular Rate:  62 PR Interval:  181 QRS Duration:  98 QT Interval:  401 QTC Calculation: 408 R Axis:   25  Text Interpretation: Sinus rhythm Low voltage, precordial leads Confirmed by Geroldine Berg ([id number]) on 08/29/2023 11:34:40 PM  Radiology: No results found.  {Document cardiac monitor, telemetry assessment procedure when  appropriate:32947} Procedures   Medications Ordered in the ED  cloNIDine  (CATAPRES ) tablet 0.2 mg (has no administration in time range)      {Click here for ABCD2, HEART and other calculators REFRESH Note before signing:1}                              Medical Decision Making Amount and/or Complexity of Data Reviewed Labs: ordered.  Risk Prescription drug management.   ***  {Document critical care time when appropriate  Document review of labs and clinical decision tools ie CHADS2VASC2, etc  Document your independent review of radiology images and any outside records  Document your discussion with family members, caretakers and with consultants  Document social determinants of health affecting pt's care  Document your decision making why or why not admission, treatments were needed:32947:::1}   Final diagnoses:  None    ED Discharge Orders     None

## 2023-08-29 NOTE — ED Triage Notes (Signed)
 Pt with c/o HTN that started last night (reports SBP 200s). Daughter states pt took an extra BP pill last night and that pt's BP came down but then spiked again tonight. Pt took an extra dose again of her BP med (Lisinopril  20mg ) but BP only came down to 192/92 so she came here for further evaluation.

## 2023-08-30 DIAGNOSIS — R42 Dizziness and giddiness: Secondary | ICD-10-CM | POA: Diagnosis not present

## 2023-08-30 LAB — CBC WITH DIFFERENTIAL/PLATELET
Abs Immature Granulocytes: 0.01 K/uL (ref 0.00–0.07)
Basophils Absolute: 0.1 K/uL (ref 0.0–0.1)
Basophils Relative: 1 %
Eosinophils Absolute: 0.2 K/uL (ref 0.0–0.5)
Eosinophils Relative: 3 %
HCT: 35.2 % — ABNORMAL LOW (ref 36.0–46.0)
Hemoglobin: 11.9 g/dL — ABNORMAL LOW (ref 12.0–15.0)
Immature Granulocytes: 0 %
Lymphocytes Relative: 38 %
Lymphs Abs: 2.2 K/uL (ref 0.7–4.0)
MCH: 30.1 pg (ref 26.0–34.0)
MCHC: 33.8 g/dL (ref 30.0–36.0)
MCV: 88.9 fL (ref 80.0–100.0)
Monocytes Absolute: 0.8 K/uL (ref 0.1–1.0)
Monocytes Relative: 14 %
Neutro Abs: 2.5 K/uL (ref 1.7–7.7)
Neutrophils Relative %: 44 %
Platelets: 239 K/uL (ref 150–400)
RBC: 3.96 MIL/uL (ref 3.87–5.11)
RDW: 12.5 % (ref 11.5–15.5)
WBC: 5.8 K/uL (ref 4.0–10.5)
nRBC: 0 % (ref 0.0–0.2)

## 2023-08-30 LAB — BASIC METABOLIC PANEL WITH GFR
Anion gap: 10 (ref 5–15)
BUN: 20 mg/dL (ref 8–23)
CO2: 25 mmol/L (ref 22–32)
Calcium: 9.3 mg/dL (ref 8.9–10.3)
Chloride: 93 mmol/L — ABNORMAL LOW (ref 98–111)
Creatinine, Ser: 1.09 mg/dL — ABNORMAL HIGH (ref 0.44–1.00)
GFR, Estimated: 51 mL/min — ABNORMAL LOW (ref >60)
Glucose, Bld: 111 mg/dL — ABNORMAL HIGH (ref 70–99)
Potassium: 4.4 mmol/L (ref 3.5–5.1)
Sodium: 128 mmol/L — ABNORMAL LOW (ref 135–145)

## 2023-08-30 MED ORDER — CLONIDINE HCL 0.1 MG PO TABS: 0.2000 mg | ORAL_TABLET | Freq: Three times a day (TID) | ORAL | 0 refills | Status: AC | PRN

## 2023-08-30 NOTE — Discharge Instructions (Signed)
 Begin taking clonidine  as needed if your blood pressures are in the 190-200 range consistently.  Keep a record of your blood pressures at home and follow-up with your primary doctor in the next week for a recheck and to discuss your medications.  Return to the emergency department in the meantime if you develop any new and/or concerning issues.

## 2023-09-02 DIAGNOSIS — I10 Essential (primary) hypertension: Secondary | ICD-10-CM | POA: Diagnosis not present

## 2023-09-02 DIAGNOSIS — E871 Hypo-osmolality and hyponatremia: Secondary | ICD-10-CM | POA: Diagnosis not present

## 2023-09-15 DIAGNOSIS — I10 Essential (primary) hypertension: Secondary | ICD-10-CM | POA: Diagnosis not present

## 2023-09-15 DIAGNOSIS — E871 Hypo-osmolality and hyponatremia: Secondary | ICD-10-CM | POA: Diagnosis not present

## 2023-10-07 DIAGNOSIS — Z23 Encounter for immunization: Secondary | ICD-10-CM | POA: Diagnosis not present

## 2023-12-24 DIAGNOSIS — Z961 Presence of intraocular lens: Secondary | ICD-10-CM | POA: Diagnosis not present

## 2023-12-24 DIAGNOSIS — H524 Presbyopia: Secondary | ICD-10-CM | POA: Diagnosis not present

## 2023-12-24 DIAGNOSIS — H3500 Unspecified background retinopathy: Secondary | ICD-10-CM | POA: Diagnosis not present

## 2024-01-09 ENCOUNTER — Ambulatory Visit: Admission: EM | Admit: 2024-01-09 | Discharge: 2024-01-09 | Disposition: A | Discharge status: Home / Self Care

## 2024-01-09 DIAGNOSIS — J069 Acute upper respiratory infection, unspecified: Secondary | ICD-10-CM

## 2024-01-09 MED ORDER — PROMETHAZINE-DM 6.25-15 MG/5ML PO SYRP: 5.0000 mL | ORAL_SOLUTION | Freq: Four times a day (QID) | ORAL | 0 refills | Status: AC | PRN

## 2024-01-09 MED ORDER — AZELASTINE HCL 0.1 % NA SOLN: 1.0000 | Freq: Two times a day (BID) | NASAL | 0 refills | Status: AC

## 2024-01-09 NOTE — ED Triage Notes (Signed)
 Pt reports sore throat, cough, congestion, denies fever started yesterday.

## 2024-01-09 NOTE — Discharge Instructions (Signed)
 You may take over-the-counter remedies such as Coricidin HBP, plain Mucinex, use saline sinus rinses, vapor rubs, humidifiers in addition to the prescribed medications for symptomatic relief.  Follow-up for significantly worsening symptoms.

## 2024-01-09 NOTE — ED Provider Notes (Signed)
 " [facility]    CSN: [id number] Arrival date & time: 01/09/24  0807      History   Chief Complaint No chief complaint on file.   HPI Jaime Lin is a 83 y.o. female.   Patient presenting today with 1 day history of scratchy throat, cough, congestion.  Denies fever, chills, body aches, chest pain, shortness of breath, abdominal pain, vomiting, diarrhea.  So far trying Mucinex, throat drops with mild temporary benefit.  No known history of chronic pulmonary disease.    Past Medical History:  Diagnosis Date   Acid reflux    Hypertension    PUD (peptic ulcer disease)     Patient Active Problem List   Diagnosis Date Noted   Hyponatremia 10/08/2021   HTN (hypertension) 10/08/2021   PUD (peptic ulcer disease) 10/08/2021    Past Surgical History:  Procedure Laterality Date   ABDOMINAL HYSTERECTOMY     APPENDECTOMY     CHOLECYSTECTOMY     COLONOSCOPY     COLONOSCOPY N/A 09/05/2015   Procedure: COLONOSCOPY;  Surgeon: Claudis RAYMOND Rivet, MD;  Location: [facility];  Service: Endoscopy;  Laterality: N/A;  925    OB History   No obstetric history on file.      Home Medications    Prior to Admission medications  Medication Sig Start Date End Date Taking? Authorizing Provider  azelastine  (ASTELIN ) 0.1 % nasal spray Place 1 spray into both nostrils 2 (two) times daily. Use in each nostril as directed 01/09/24  Yes Stuart Vernell Norris, PA-C  promethazine -dextromethorphan (PROMETHAZINE -DM) 6.25-15 MG/5ML syrup Take 5 mLs by mouth 4 (four) times daily as needed. 01/09/24  Yes Stuart Vernell Norris, PA-C  aspirin EC 81 MG tablet Take 162 mg by mouth daily. Swallow whole.    [provider]  Calcium Carbonate-Vitamin D3 (CALCIUM 600-D) 600-400 MG-UNIT TABS Take 1 tablet by mouth daily.    [provider]  Cholecalciferol (VITAMIN D) 50 MCG (2000 UT) CAPS Take 1 capsule by mouth daily.    [provider]  cloNIDine  (CATAPRES ) 0.1 MG  tablet Take 2 tablets (0.2 mg total) by mouth every 8 (eight) hours as needed. 08/30/23   Geroldine Berg, MD  Multiple Vitamin (MULTIVITAMIN WITH MINERALS) TABS tablet Take 1 tablet by mouth daily.    [provider]  olmesartan (BENICAR) 20 MG tablet Take 20 mg by mouth daily.    [provider]  pantoprazole  (PROTONIX ) 20 MG tablet Take 20 mg by mouth daily. 08/21/21   [provider]    Family History Family History  Problem Relation Age of Onset   Cirrhosis Mother    Hypertension Father    Stomach cancer Father    Coronary artery disease Sister    Prostate cancer Brother    Sleep apnea Brother    Breast cancer Sister     Social History Social History[1]   Allergies   Meclizine , Omeprazole, Voltaren [diclofenac sodium], and Diclofenac sodium   Review of Systems Review of Systems Per HPI  Physical Exam Triage Vital Signs ED Triage Vitals  Encounter Vitals Group     BP 01/09/24 0823 (!) 99/49     Girls Systolic BP Percentile --      Girls Diastolic BP Percentile --      Boys Systolic BP Percentile --      Boys Diastolic BP Percentile --      Pulse Rate 01/09/24 0823 78     Resp 01/09/24 0823 18  Temp 01/09/24 0823 98.1 F (36.7 C)     Temp Source 01/09/24 0823 Oral     SpO2 01/09/24 0823 98 %     Weight --      Height --      Head Circumference --      Peak Flow --      Pain Score 01/09/24 0826 0     Pain Loc --      Pain Education --      Exclude from Growth Chart --    No data found.  Updated Vital Signs BP (!) 99/49 (BP Location: Right Arm)   Pulse 78   Temp 98.1 F (36.7 C) (Oral)   Resp 18   SpO2 98%   Visual Acuity Right Eye Distance:   Left Eye Distance:   Bilateral Distance:    Right Eye Near:   Left Eye Near:    Bilateral Near:     Physical Exam Vitals and nursing note reviewed.  Constitutional:      Appearance: Normal appearance. She is not ill-appearing.  HENT:     Head: Atraumatic.     Right Ear:  Tympanic membrane and external ear normal.     Left Ear: Tympanic membrane and external ear normal.     Nose: Rhinorrhea present.     Mouth/Throat:     Mouth: Mucous membranes are moist.     Pharynx: Posterior oropharyngeal erythema present.  Eyes:     Extraocular Movements: Extraocular movements intact.     Conjunctiva/sclera: Conjunctivae normal.  Cardiovascular:     Rate and Rhythm: Normal rate and regular rhythm.     Heart sounds: Normal heart sounds.  Pulmonary:     Effort: Pulmonary effort is normal.     Breath sounds: Normal breath sounds. No wheezing or rales.  Musculoskeletal:        General: Normal range of motion.     Cervical back: Normal range of motion and neck supple.  Skin:    General: Skin is warm and dry.  Neurological:     Mental Status: She is alert and oriented to person, place, and time.  Psychiatric:        Mood and Affect: Mood normal.        Thought Content: Thought content normal.        Judgment: Judgment normal.      UC Treatments / Results  Labs (all labs ordered are listed, but only abnormal results are displayed) Labs Reviewed - No data to display  EKG   Radiology No results found.  Procedures Procedures (including critical care time)  Medications Ordered in UC Medications - No data to display  Initial Impression / Assessment and Plan / UC Course  I have reviewed the triage vital signs and the nursing notes.  Pertinent labs & imaging results that were available during my care of the patient were reviewed by me and considered in my medical decision making (see chart for details).     Vitals and exam reassuring today, declines viral testing with shared decision making.  Suspect viral respiratory infection.  Treat with Astelin , Phenergan  DM, supportive over-the-counter medications and home care.  Return for worsening or unresolving symptoms.  Final Clinical Impressions(s) / UC Diagnoses   Final diagnoses:  Viral URI with cough      Discharge Instructions      You may take over-the-counter remedies such as Coricidin HBP, plain Mucinex, use saline sinus rinses, vapor rubs, humidifiers in addition to the  prescribed medications for symptomatic relief.  Follow-up for significantly worsening symptoms.    ED Prescriptions     Medication Sig Dispense Auth. Provider   promethazine -dextromethorphan (PROMETHAZINE -DM) 6.25-15 MG/5ML syrup Take 5 mLs by mouth 4 (four) times daily as needed. 100 mL Stuart Vernell Norris, PA-C   azelastine  (ASTELIN ) 0.1 % nasal spray Place 1 spray into both nostrils 2 (two) times daily. Use in each nostril as directed 30 mL Stuart Vernell Norris, PA-C      PDMP not reviewed this encounter.    [1]  Social History Tobacco Use   Smoking status: Never   Smokeless tobacco: Never  Substance Use Topics   Alcohol use: No   Drug use: No     Stuart Vernell Norris, PA-C 01/09/24 9073  "

## 2024-01-11 ENCOUNTER — Emergency Department (HOSPITAL_COMMUNITY)
Admission: EM | Admit: 2024-01-11 | Discharge: 2024-01-11 | Disposition: A | Discharge status: Home / Self Care | Attending: Emergency Medicine | Admitting: Emergency Medicine

## 2024-01-11 ENCOUNTER — Emergency Department (HOSPITAL_COMMUNITY)

## 2024-01-11 ENCOUNTER — Encounter (HOSPITAL_COMMUNITY): Payer: Self-pay

## 2024-01-11 ENCOUNTER — Other Ambulatory Visit: Payer: Self-pay

## 2024-01-11 DIAGNOSIS — E860 Dehydration: Secondary | ICD-10-CM | POA: Diagnosis not present

## 2024-01-11 DIAGNOSIS — I10 Essential (primary) hypertension: Secondary | ICD-10-CM | POA: Insufficient documentation

## 2024-01-11 DIAGNOSIS — R059 Cough, unspecified: Secondary | ICD-10-CM | POA: Insufficient documentation

## 2024-01-11 DIAGNOSIS — R058 Other specified cough: Secondary | ICD-10-CM

## 2024-01-11 LAB — CBC WITH DIFFERENTIAL/PLATELET
Abs Immature Granulocytes: 0.01 K/uL (ref 0.00–0.07)
Basophils Absolute: 0.1 K/uL (ref 0.0–0.1)
Basophils Relative: 2 %
Eosinophils Absolute: 0 K/uL (ref 0.0–0.5)
Eosinophils Relative: 1 %
HCT: 32 % — ABNORMAL LOW (ref 36.0–46.0)
Hemoglobin: 11 g/dL — ABNORMAL LOW (ref 12.0–15.0)
Immature Granulocytes: 0 %
Lymphocytes Relative: 15 %
Lymphs Abs: 0.6 K/uL — ABNORMAL LOW (ref 0.7–4.0)
MCH: 30.3 pg (ref 26.0–34.0)
MCHC: 34.4 g/dL (ref 30.0–36.0)
MCV: 88.2 fL (ref 80.0–100.0)
Monocytes Absolute: 0.7 K/uL (ref 0.1–1.0)
Monocytes Relative: 19 %
Neutro Abs: 2.5 K/uL (ref 1.7–7.7)
Neutrophils Relative %: 63 %
Platelets: 190 K/uL (ref 150–400)
RBC: 3.63 MIL/uL — ABNORMAL LOW (ref 3.87–5.11)
RDW: 12.8 % (ref 11.5–15.5)
WBC: 3.9 K/uL — ABNORMAL LOW (ref 4.0–10.5)
nRBC: 0 % (ref 0.0–0.2)

## 2024-01-11 LAB — MAGNESIUM: Magnesium: 1.3 mg/dL — ABNORMAL LOW (ref 1.7–2.4)

## 2024-01-11 LAB — BASIC METABOLIC PANEL WITH GFR
Anion gap: 16 — ABNORMAL HIGH (ref 5–15)
BUN: 21 mg/dL (ref 8–23)
CO2: 21 mmol/L — ABNORMAL LOW (ref 22–32)
Calcium: 9 mg/dL (ref 8.9–10.3)
Chloride: 91 mmol/L — ABNORMAL LOW (ref 98–111)
Creatinine, Ser: 1.25 mg/dL — ABNORMAL HIGH (ref 0.44–1.00)
GFR, Estimated: 43 mL/min — ABNORMAL LOW
Glucose, Bld: 99 mg/dL (ref 70–99)
Potassium: 3.7 mmol/L (ref 3.5–5.1)
Sodium: 127 mmol/L — ABNORMAL LOW (ref 135–145)

## 2024-01-11 MED ORDER — ACETAMINOPHEN 325 MG PO TABS
650.0000 mg | ORAL_TABLET | Freq: Once | ORAL | Status: AC
  Administered 2024-01-11: 650 mg via ORAL
  Filled 2024-01-11: qty 2

## 2024-01-11 MED ORDER — PREDNISONE 50 MG PO TABS
60.0000 mg | ORAL_TABLET | Freq: Once | ORAL | Status: AC
  Administered 2024-01-11: 60 mg via ORAL
  Filled 2024-01-11: qty 1

## 2024-01-11 MED ORDER — PREDNISONE 10 MG PO TABS: 40.0000 mg | ORAL_TABLET | Freq: Every day | ORAL | 0 refills | Status: AC

## 2024-01-11 MED ORDER — AZITHROMYCIN 250 MG PO TABS
500.0000 mg | ORAL_TABLET | Freq: Once | ORAL | Status: AC
  Administered 2024-01-11: 500 mg via ORAL
  Filled 2024-01-11: qty 2

## 2024-01-11 MED ORDER — MAGNESIUM SULFATE 2 GM/50ML IV SOLN
2.0000 g | Freq: Once | INTRAVENOUS | Status: AC
  Administered 2024-01-11: 2 g via INTRAVENOUS
  Filled 2024-01-11: qty 50

## 2024-01-11 MED ORDER — SODIUM CHLORIDE 0.9 % IV BOLUS: 1000.0000 mL | Freq: Once | INTRAVENOUS | Status: DC

## 2024-01-11 MED ORDER — AZITHROMYCIN 250 MG PO TABS: 250.0000 mg | ORAL_TABLET | Freq: Every day | ORAL | 0 refills | Status: AC

## 2024-01-11 MED ORDER — ONDANSETRON 4 MG PO TBDP: 4.0000 mg | ORAL_TABLET | Freq: Three times a day (TID) | ORAL | 0 refills | Status: AC | PRN

## 2024-01-11 MED ORDER — ONDANSETRON HCL 4 MG/2ML IJ SOLN
4.0000 mg | Freq: Once | INTRAMUSCULAR | Status: AC
  Administered 2024-01-11: 4 mg via INTRAVENOUS
  Filled 2024-01-11: qty 2

## 2024-01-11 NOTE — ED Provider Notes (Signed)
 " [place] EMERGENCY DEPARTMENT AT [facility] Provider Note   CSN: [id number] Arrival date & time: 01/11/24  1057     Patient presents with: flu like symptoms   Jaime Lin is a 83 y.o. female.   HPI Patient presents for near syncope.  Medical history includes PAD, HTN, hyponatremia.  She had onset of cough and congestion 3 days ago.  She was seen at urgent care 2 days ago.  Viral infection was suspected.  She was advised supportive care at home.  She has severe coughing initially but intensity has since decreased.  She has been having new productive green mucus.  She has had poor p.o. intake due to nausea with trying to eat or drink.  She had onset of diarrhea overnight.  This was reportedly multiple episodes.  Today, she has had lightheadedness with standing and walking.    Prior to Admission medications  Medication Sig Start Date End Date Taking? Authorizing Provider  azelastine  (ASTELIN ) 0.1 % nasal spray Place 1 spray into both nostrils 2 (two) times daily. Use in each nostril as directed 01/09/24  Yes Stuart Vernell Norris, PA-C  azithromycin  (ZITHROMAX ) 250 MG tablet Take 1 tablet (250 mg total) by mouth daily. Take 1 every day until finished, starting tomorrow. 01/11/24  Yes Melvenia Motto, MD  CALCIUM CARB-CHOLECALCIFEROL PO Take 1 tablet by mouth daily.   Yes [provider]  Cholecalciferol (VITAMIN D) 50 MCG (2000 UT) CAPS Take 1 capsule by mouth daily.   Yes [provider]  cloNIDine  (CATAPRES ) 0.1 MG tablet Take 2 tablets (0.2 mg total) by mouth every 8 (eight) hours as needed. 08/30/23  Yes Delo, Vicenta, MD  Multiple Vitamin (MULTIVITAMIN WITH MINERALS) TABS tablet Take 1 tablet by mouth daily.   Yes [provider]  olmesartan (BENICAR) 20 MG tablet Take 20 mg by mouth daily.   Yes [provider]  ondansetron  (ZOFRAN -ODT) 4 MG disintegrating tablet Take 1 tablet (4 mg total) by mouth every 8 (eight) hours as needed. 01/11/24   Yes Melvenia Motto, MD  pantoprazole  (PROTONIX ) 20 MG tablet Take 20 mg by mouth daily.   Yes [provider]  predniSONE  (DELTASONE ) 10 MG tablet Take 4 tablets (40 mg total) by mouth daily for 4 days. Starting tomorrow 01/11/24 01/15/24 Yes Melvenia Motto, MD  promethazine -dextromethorphan (PROMETHAZINE -DM) 6.25-15 MG/5ML syrup Take 5 mLs by mouth 4 (four) times daily as needed. 01/09/24  Yes Stuart Vernell Norris, PA-C    Allergies: Meclizine , Omeprazole, Voltaren [diclofenac sodium], and Diclofenac sodium    Review of Systems  Constitutional:  Positive for appetite change.  Respiratory:  Positive for cough.   Gastrointestinal:  Positive for diarrhea and nausea.  Neurological:  Positive for light-headedness.  All other systems reviewed and are negative.   Updated Vital Signs BP (!) 107/92 (BP Location: Right Arm)   Pulse 78   Temp 98.3 F (36.8 C)   Resp 16   Wt 74.8 kg   SpO2 97%   BMI 26.63 kg/m   Physical Exam Vitals and nursing note reviewed.  Constitutional:      General: She is not in acute distress.    Appearance: Normal appearance. She is well-developed. She is not ill-appearing, toxic-appearing or diaphoretic.  HENT:     Head: Normocephalic and atraumatic.     Right Ear: External ear normal.     Left Ear: External ear normal.     Nose: Nose normal.     Mouth/Throat:  Mouth: Mucous membranes are moist.  Eyes:     Extraocular Movements: Extraocular movements intact.     Conjunctiva/sclera: Conjunctivae normal.  Cardiovascular:     Rate and Rhythm: Normal rate and regular rhythm.     Heart sounds: No murmur heard. Pulmonary:     Effort: Pulmonary effort is normal. No respiratory distress.     Breath sounds: Normal breath sounds. No wheezing or rales.  Abdominal:     General: There is no distension.     Palpations: Abdomen is soft.     Tenderness: There is no abdominal tenderness.  Musculoskeletal:        General: No swelling. Normal range of motion.      Cervical back: Normal range of motion and neck supple.     Right lower leg: No edema.     Left lower leg: No edema.  Skin:    General: Skin is warm and dry.     Coloration: Skin is not jaundiced or pale.  Neurological:     General: No focal deficit present.     Mental Status: She is alert and oriented to person, place, and time.     Cranial Nerves: No cranial nerve deficit.     Sensory: No sensory deficit.     Motor: No weakness.     Coordination: Coordination normal.  Psychiatric:        Mood and Affect: Mood normal.        Behavior: Behavior normal.     (all labs ordered are listed, but only abnormal results are displayed) Labs Reviewed  CBC WITH DIFFERENTIAL/PLATELET - Abnormal; Notable for the following components:      Result Value   WBC 3.9 (*)    RBC 3.63 (*)    Hemoglobin 11.0 (*)    HCT 32.0 (*)    Lymphs Abs 0.6 (*)    All other components within normal limits  BASIC METABOLIC PANEL WITH GFR - Abnormal; Notable for the following components:   Sodium 127 (*)    Chloride 91 (*)    CO2 21 (*)    Creatinine, Ser 1.25 (*)    GFR, Estimated 43 (*)    Anion gap 16 (*)    All other components within normal limits  MAGNESIUM  - Abnormal; Notable for the following components:   Magnesium  1.3 (*)    All other components within normal limits    EKG: EKG Interpretation Date/Time:  Tuesday January 11 2024 14:03:45 EST Ventricular Rate:  73 PR Interval:  178 QRS Duration:  99 QT Interval:  375 QTC Calculation: 414 R Axis:   73  Text Interpretation: Sinus rhythm Low voltage, precordial leads Borderline repolarization abnormality Confirmed by Melvenia Motto [phone]) on 01/11/2024 2:08:22 PM  Radiology: DG Chest 2 View Result Date: 01/11/2024 CLINICAL DATA:  Cough EXAM: CHEST - 2 VIEW COMPARISON:  October 07, 2021 FINDINGS: The heart size and mediastinal contours are within normal limits. Both lungs are clear. The visualized skeletal structures are unremarkable.  IMPRESSION: No active cardiopulmonary disease. Electronically Signed   By: Lynwood Landy Raddle M.D.   On: 01/11/2024 13:14     Procedures   Medications Ordered in the ED  magnesium  sulfate IVPB 2 g 50 mL (2 g Intravenous New Bag/Given 01/11/24 1350)  sodium chloride  0.9 % bolus 1,000 mL (1,000 mLs Intravenous Bolus 01/11/24 1346)  ondansetron  (ZOFRAN ) injection 4 mg (4 mg Intravenous Given 01/11/24 1346)  acetaminophen  (TYLENOL ) tablet 650 mg (650 mg Oral Given 01/11/24 1352)  azithromycin  (ZITHROMAX ) tablet 500 mg (500 mg Oral Given 01/11/24 1429)  predniSONE  (DELTASONE ) tablet 60 mg (60 mg Oral Given 01/11/24 1429)                                    Medical Decision Making Amount and/or Complexity of Data Reviewed Labs: ordered. Radiology: ordered.  Risk OTC drugs. Prescription drug management.   This patient presents to the ED for concern of cough, lightheadedness, this involves an extensive number of treatment options, and is a complaint that carries with it a high risk of complications and morbidity.  The differential diagnosis includes URI, dehydration, metabolic derangements, anemia, pneumonia   Co morbidities / Chronic conditions that complicate the patient evaluation  PAD, HTN, hyponatremia   Additional history obtained:  Additional history obtained from EMR External records from outside source obtained and reviewed including N/A   Lab Tests:  I Ordered, and personally interpreted labs.  The pertinent results include: Sodium is mildly decreased from baseline.  Hypomagnesemia is present.  Creatinine mildly increased from baseline.  Mild leukopenia present consistent with viral process.   Imaging Studies ordered:  I ordered imaging studies including chest x-ray I independently visualized and interpreted imaging which showed no acute findings I agree with the radiologist interpretation   Cardiac Monitoring: / EKG:  The patient was maintained on a cardiac  monitor.  I personally viewed and interpreted the cardiac monitored which showed an underlying rhythm of: Sinus rhythm   Problem List / ED Course / Critical interventions / Medication management  Patient presenting for lightheadedness and near syncopal symptoms today.  This is in the setting of 3 days of URI symptoms.  She has had ongoing cough and nausea anytime she tries to eat or drink.  She also had onset of diarrhea last night.  On arrival in the ED, patient is well-appearing.  She denies any current areas of discomfort.  She denies any recent shortness of breath.  Her lungs are clear to auscultation.  Her abdomen is soft and nontender.  Given her poor p.o. intake and fluid losses, IV fluids were ordered for hydration.  Will trial Zofran  to help with p.o. intake.  Laboratory workup initiated.  Patient creatinine is mildly increased from baseline.  She does have hypomagnesemia present.  Replacement magnesium  was ordered.  Her sodium does appear to be slightly decreased from baseline.  This should improve with IV fluids and magnesium  replacement.  Patient was informed of lab work results.  Given her cough productive of green sputum, will initiate azithromycin  and prednisone .  On reassessment, patient resting comfortably.  She is stable for discharge. I ordered medication including IV fluids for hydration, magnesium  sulfate for hypomagnesemia; Zofran  for nausea; azithromycin  prednisone  for productive cough Reevaluation of the patient after these medicines showed that the patient improved I have reviewed the patients home medicines and have made adjustments as needed  Social Determinants of Health:  Lives independently     Final diagnoses:  Productive cough  Hypomagnesemia  Dehydration    ED Discharge Orders          Ordered    azithromycin  (ZITHROMAX ) 250 MG tablet  Daily        01/11/24 1428    predniSONE  (DELTASONE ) 10 MG tablet  Daily        01/11/24 1428    ondansetron  (ZOFRAN -ODT)  4 MG disintegrating tablet  Every 8 hours PRN  01/11/24 1428               Melvenia Motto, MD 01/11/24 1430  "

## 2024-01-11 NOTE — Discharge Instructions (Addendum)
 Your sodium and magnesium  were low.  You did receive fluids and magnesium  replacement in the emergency department.  Continue to drink fluids and eat salt containing foods to maintain hydration and normal electrolyte levels.  A prescription for a medication called ondansetron  was sent to your pharmacy.  Take this as needed to help with your nausea.  Prescriptions for azithromycin  and prednisone  were also sent to your pharmacy.  Take as prescribed starting tomorrow.  Return to the emergency department for any new or worsening symptoms of concern.

## 2024-01-11 NOTE — ED Triage Notes (Signed)
 Pt reports cough that started on Saturday and went to UC and told she had a virus and was prescribed cough medicine with promethazine  but could not pick it up until Monday because the pharmacy was not open.  Pt reports she now has diarrhea as well.
